# Patient Record
Sex: Male | Born: 1990 | Race: Black or African American | Hispanic: No | Marital: Single | Smoking: Former smoker
Health system: Southern US, Community
[De-identification: ages and names within clinical notes are randomized; demographics above are authoritative.]

## PROBLEM LIST (undated history)

## (undated) DIAGNOSIS — R569 Unspecified convulsions: Secondary | ICD-10-CM

---
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
## 2010-05-20 ENCOUNTER — Emergency Department (HOSPITAL_COMMUNITY): Payer: No Typology Code available for payment source

## 2010-05-20 ENCOUNTER — Emergency Department (HOSPITAL_COMMUNITY)
Admission: EM | Admit: 2010-05-20 | Discharge: 2010-05-20 | Disposition: A | Discharge status: Home / Self Care | Payer: No Typology Code available for payment source | Attending: Emergency Medicine | Admitting: Emergency Medicine

## 2010-05-20 DIAGNOSIS — M79609 Pain in unspecified limb: Secondary | ICD-10-CM | POA: Insufficient documentation

## 2010-05-20 DIAGNOSIS — S8000XA Contusion of unspecified knee, initial encounter: Secondary | ICD-10-CM | POA: Insufficient documentation

## 2010-05-20 DIAGNOSIS — S60229A Contusion of unspecified hand, initial encounter: Secondary | ICD-10-CM | POA: Insufficient documentation

## 2010-05-20 DIAGNOSIS — IMO0002 Reserved for concepts with insufficient information to code with codable children: Secondary | ICD-10-CM | POA: Insufficient documentation

## 2010-05-20 DIAGNOSIS — M25569 Pain in unspecified knee: Secondary | ICD-10-CM | POA: Insufficient documentation

## 2010-05-21 ENCOUNTER — Emergency Department (HOSPITAL_COMMUNITY)
Admission: EM | Admit: 2010-05-21 | Discharge: 2010-05-21 | Disposition: A | Discharge status: Home / Self Care | Payer: No Typology Code available for payment source | Attending: Emergency Medicine | Admitting: Emergency Medicine

## 2010-05-21 DIAGNOSIS — M542 Cervicalgia: Secondary | ICD-10-CM | POA: Insufficient documentation

## 2010-05-21 DIAGNOSIS — S139XXA Sprain of joints and ligaments of unspecified parts of neck, initial encounter: Secondary | ICD-10-CM | POA: Insufficient documentation

## 2010-05-21 DIAGNOSIS — M546 Pain in thoracic spine: Secondary | ICD-10-CM | POA: Insufficient documentation

## 2010-05-21 DIAGNOSIS — M79609 Pain in unspecified limb: Secondary | ICD-10-CM | POA: Insufficient documentation

## 2010-11-20 ENCOUNTER — Emergency Department (HOSPITAL_COMMUNITY): Admission: EM | Admit: 2010-11-20 | Payer: No Typology Code available for payment source | Source: Home / Self Care

## 2012-10-04 ENCOUNTER — Emergency Department (HOSPITAL_COMMUNITY)
Admission: EM | Admit: 2012-10-04 | Discharge: 2012-10-04 | Disposition: A | Discharge status: Home / Self Care | Payer: Self-pay | Attending: Emergency Medicine | Admitting: Emergency Medicine

## 2012-10-04 ENCOUNTER — Encounter (HOSPITAL_COMMUNITY): Payer: Self-pay | Admitting: Emergency Medicine

## 2012-10-04 ENCOUNTER — Emergency Department (HOSPITAL_COMMUNITY): Payer: Self-pay

## 2012-10-04 DIAGNOSIS — R569 Unspecified convulsions: Secondary | ICD-10-CM | POA: Insufficient documentation

## 2012-10-04 DIAGNOSIS — R51 Headache: Secondary | ICD-10-CM | POA: Insufficient documentation

## 2012-10-04 DIAGNOSIS — Z87891 Personal history of nicotine dependence: Secondary | ICD-10-CM | POA: Insufficient documentation

## 2012-10-04 DIAGNOSIS — F1010 Alcohol abuse, uncomplicated: Secondary | ICD-10-CM | POA: Insufficient documentation

## 2012-10-04 LAB — RAPID URINE DRUG SCREEN, HOSP PERFORMED
Barbiturates: NOT DETECTED
Benzodiazepines: NOT DETECTED
Cocaine: NOT DETECTED
Opiates: NOT DETECTED

## 2012-10-04 LAB — BASIC METABOLIC PANEL
BUN: 7 mg/dL (ref 6–23)
CO2: 23 mEq/L (ref 19–32)
Calcium: 8.4 mg/dL (ref 8.4–10.5)
Chloride: 95 mEq/L — ABNORMAL LOW (ref 96–112)
Creatinine, Ser: 1.02 mg/dL (ref 0.50–1.35)
GFR calc Af Amer: >90 mL/min (ref >90)
GFR calc Af Amer: >90 mL/min (ref >90)
GFR calc non Af Amer: >90 mL/min (ref >90)
GFR calc non Af Amer: >90 mL/min (ref >90)
Glucose, Bld: 230 mg/dL — ABNORMAL HIGH (ref 70–99)
Glucose, Bld: 71 mg/dL (ref 70–99)
Sodium: 135 mEq/L (ref 135–145)

## 2012-10-04 LAB — CBC
MCHC: 34.4 g/dL (ref 30.0–36.0)
MCV: 85.2 fL (ref 78.0–100.0)
WBC: 7.8 10*3/uL (ref 4.0–10.5)

## 2012-10-04 LAB — GLUCOSE, CAPILLARY: Glucose-Capillary: 226 mg/dL — ABNORMAL HIGH (ref 70–99)

## 2012-10-04 MED ORDER — SODIUM CHLORIDE 0.9 % IV BOLUS (SEPSIS)
1000.0000 mL | Freq: Once | INTRAVENOUS | Status: AC
  Administered 2012-10-04: 1000 mL via INTRAVENOUS

## 2012-10-04 MED ORDER — ACETAMINOPHEN 325 MG PO TABS
650.0000 mg | ORAL_TABLET | Freq: Once | ORAL | Status: AC
  Administered 2012-10-04: 650 mg via ORAL
  Filled 2012-10-04: qty 2

## 2012-10-04 NOTE — ED Notes (Signed)
Pt undressed, in gown, on monitor, continuous pulse oximetry and blood pressure cuff; family at bedside 

## 2012-10-04 NOTE — ED Notes (Signed)
Per EMS - they were called out because pt had a seizure, no hx of seizures. Witnessed by family, heard him fall then ran over and his full body was shaking lasting approx 2 mins. Pt denies recent injuries/head traumas. No medical hx/NKA. Upon EMS arrival pt was still post-ictal, asked what happened a few times but now aware and can remember, pt in a&ox4. EMS started a 20G in left hand. Pt c/o HA and nausea, no vomiting. EMS administered 4mg  of zofran. CBG 151. BP 110/70. HR 100 96% on room air.

## 2012-10-04 NOTE — ED Provider Notes (Signed)
History    CSN: [id number] Arrival date & time 10/04/12  1130  First MD Initiated Contact with Patient 10/04/12 1152     Chief Complaint  Patient presents with  . Seizures   (Consider location/radiation/quality/duration/timing/severity/associated sxs/prior Treatment) The history is provided by the patient.    Patient presents to the ED for new onset seizure. Grandmother heard the patient fall and ran over to him. She noticed that his body from the waist up was shaking uncontrollably for approximately 2 minutes and then suddenly stopped.  Pt was post-ictal upon EMS arrival.  No recent injuries or head trauma. No diagnosis of seizure disorder or prior seizures.  When questioned about event, patient states he only members waking up with family surrounding him. He states he was oriented but now is very tired and has a generalized headache.  Patient admits to occasional EtOH and marijuana use. CBG en Route 151. BP 110/70. O2 sat stable at 96%. Patient denies any chest pain, shortness of breath, palpitations, dizziness, weakness, abdominal pain, or nausea.  No recent illness, fevers, sweats, or chills.  History reviewed. No pertinent past medical history. History reviewed. No pertinent past surgical history. No family history on file. History  Substance Use Topics  . Smoking status: Former Smoker    Types: Cigarettes  . Smokeless tobacco: Not on file  . Alcohol Use: Yes     Comment: ocasionally    Review of Systems  Neurological: Positive for seizures.  All other systems reviewed and are negative.    Allergies  Review of patient's allergies indicates no known allergies.  Home Medications  No current outpatient prescriptions on file. BP 104/44  Pulse 75  Resp 23  SpO2 94%  Physical Exam  Nursing note and vitals reviewed. Constitutional: He is oriented to person, place, and time. He appears well-developed and well-nourished.  HENT:  Head: Normocephalic. Head is without  raccoon's eyes, without Battle's sign, without abrasion, without contusion and without laceration.  Mouth/Throat: Oropharynx is clear and moist and mucous membranes are normal. No oral lesions. No lacerations.  No signs of head trauma or oral mucosa/tongue injury  Eyes: Conjunctivae and EOM are normal. Pupils are equal, round, and reactive to light.  Neck: Normal range of motion. Neck supple.  Cardiovascular: Normal rate, regular rhythm and normal heart sounds.   Pulmonary/Chest: Effort normal and breath sounds normal. No respiratory distress. He has no wheezes.  Abdominal: Soft. Bowel sounds are normal. There is no tenderness. There is no guarding.  Musculoskeletal: Normal range of motion.  Neurological: He is alert and oriented to person, place, and time. He has normal strength. He displays no tremor. No cranial nerve deficit or sensory deficit. He displays no seizure activity.  CN grossly intact, moves all extremities appropriately, no acute neuro deficits or facial droop appreciated, no active tremors or seizure activity   Skin: Skin is warm and dry.  Psychiatric: He has a normal mood and affect.    ED Course  Procedures (including critical care time)   Date: 10/04/2012  Rate: 68  Rhythm: normal sinus rhythm  QRS Axis: normal  Intervals: normal  ST/T Wave abnormalities: normal  Conduction Disutrbances:none  Narrative Interpretation: NSR, no STEMI  Old EKG Reviewed: none available    Labs Reviewed  BASIC METABOLIC PANEL - Abnormal; Notable for the following:    Chloride 95 (*)    CO2 16 (*)    Glucose, Bld 230 (*)    All other components within normal limits  URINE RAPID DRUG SCREEN (HOSP PERFORMED) - Abnormal; Notable for the following:    Tetrahydrocannabinol POSITIVE (*)    All other components within normal limits  GLUCOSE, CAPILLARY - Abnormal; Notable for the following:    Glucose-Capillary 226 (*)    All other components within normal limits  CBC  ETHANOL  BASIC  METABOLIC PANEL   Ct Head Wo Contrast   (if New Onset Seizure And/or Head Trauma)  10/04/2012   *RADIOLOGY REPORT*  Clinical Data: New onset seizure.  Headache and nausea.  CT HEAD WITHOUT CONTRAST  Technique:  Contiguous axial images were obtained from the base of the skull through the vertex without contrast.  Comparison: None.  Findings: Middle cerebral arteries appears slightly dense bilaterally, which can be seen in the setting of dehydration.  No evidence of acute infarct, acute hemorrhage, mass lesion, mass effect or hydrocephalus.  Visualized portions of the paranasal sinuses are clear.  Fluid is seen in the right mastoid air cells. Left mastoid air cells are clear.  No fracture.  IMPRESSION:  1.  No acute intracranial abnormality. 2.  Right mastoid effusion.   Original Report Authenticated By: Leanna Battles, M.D.   1. Seizure     MDM    EKG normal sinus rhythm, no acute ischemic changes.  CT head negative.  Electrolyte abnormalities as above, anion gap elevated at 24. UDS positive for marijuana. Patient given 2 L of fluid and will reassess anion gap correction with repeat BMP.  Repeat BMP WNL.  Pt remained AAOx4 without recurrent tremors or seizure activity, headache resolved with PO Tylenol.  Encouraged to establish care with PCP in the area for FU- resource guide given.  Discussed plan with pt and mother, they agreed.  Return precautions advised.       Garlon Hatchet, PA-C 10/04/12 1744

## 2012-10-04 NOTE — ED Notes (Signed)
Pt reports he doesn't really remember anything. Pt's mother reports his grandmother was at the house and the pt had just received a package in the mail he was showing her the package then went upstairs, next thing she heard a loud noise and went to check on him, he was on the floor having seizure like activity, saw his upper body shaking. Pt in nad, skin warm and dry, resp e/u. Pt c/o HA and feeling hot.

## 2012-10-04 NOTE — ED Notes (Signed)
Pt admits to smoking weed last night.

## 2012-10-04 NOTE — ED Notes (Signed)
Pt asked from urine sample. Reports he will try to go.

## 2012-10-04 NOTE — ED Notes (Signed)
Pt reports he didn't have a chance to eat this morning. sts his fathers side of the family has some DM hx.

## 2012-10-05 NOTE — ED Provider Notes (Signed)
Medical screening examination/treatment/procedure(s) were performed by non-physician practitioner and as supervising physician I was immediately available for consultation/collaboration.  Aisea Bouldin M Verble Styron, MD 10/05/12 1411 

## 2013-07-25 ENCOUNTER — Emergency Department (HOSPITAL_COMMUNITY)
Admission: EM | Admit: 2013-07-25 | Discharge: 2013-07-25 | Disposition: A | Discharge status: Home / Self Care | Payer: No Typology Code available for payment source | Attending: Emergency Medicine | Admitting: Emergency Medicine

## 2013-07-25 ENCOUNTER — Encounter (HOSPITAL_COMMUNITY): Payer: Self-pay | Admitting: Emergency Medicine

## 2013-07-25 ENCOUNTER — Emergency Department (HOSPITAL_COMMUNITY): Payer: No Typology Code available for payment source

## 2013-07-25 DIAGNOSIS — R5383 Other fatigue: Secondary | ICD-10-CM

## 2013-07-25 DIAGNOSIS — R109 Unspecified abdominal pain: Secondary | ICD-10-CM | POA: Insufficient documentation

## 2013-07-25 DIAGNOSIS — N4889 Other specified disorders of penis: Secondary | ICD-10-CM | POA: Insufficient documentation

## 2013-07-25 DIAGNOSIS — Z87891 Personal history of nicotine dependence: Secondary | ICD-10-CM | POA: Insufficient documentation

## 2013-07-25 DIAGNOSIS — R0602 Shortness of breath: Secondary | ICD-10-CM | POA: Insufficient documentation

## 2013-07-25 DIAGNOSIS — Z9889 Other specified postprocedural states: Secondary | ICD-10-CM | POA: Insufficient documentation

## 2013-07-25 DIAGNOSIS — R5381 Other malaise: Secondary | ICD-10-CM | POA: Insufficient documentation

## 2013-07-25 DIAGNOSIS — R112 Nausea with vomiting, unspecified: Secondary | ICD-10-CM | POA: Insufficient documentation

## 2013-07-25 DIAGNOSIS — N489 Disorder of penis, unspecified: Secondary | ICD-10-CM

## 2013-07-25 DIAGNOSIS — R079 Chest pain, unspecified: Secondary | ICD-10-CM

## 2013-07-25 DIAGNOSIS — R638 Other symptoms and signs concerning food and fluid intake: Secondary | ICD-10-CM | POA: Insufficient documentation

## 2013-07-25 DIAGNOSIS — R509 Fever, unspecified: Secondary | ICD-10-CM | POA: Insufficient documentation

## 2013-07-25 DIAGNOSIS — R0789 Other chest pain: Secondary | ICD-10-CM | POA: Insufficient documentation

## 2013-07-25 LAB — CBC WITH DIFFERENTIAL/PLATELET
Basophils Absolute: 0 10*3/uL (ref 0.0–0.1)
Basophils Relative: 1 % (ref 0–1)
EOS PCT: 1 % (ref 0–5)
Eosinophils Absolute: 0 10*3/uL (ref 0.0–0.7)
HCT: 44.2 % (ref 39.0–52.0)
HEMOGLOBIN: 15.5 g/dL (ref 13.0–17.0)
LYMPHS ABS: 2.8 10*3/uL (ref 0.7–4.0)
Lymphocytes Relative: 32 % (ref 12–46)
MCH: 30 pg (ref 26.0–34.0)
MCHC: 35.1 g/dL (ref 30.0–36.0)
MCV: 85.7 fL (ref 78.0–100.0)
Monocytes Absolute: 0.5 10*3/uL (ref 0.1–1.0)
Monocytes Relative: 6 % (ref 3–12)
Neutro Abs: 5.3 10*3/uL (ref 1.7–7.7)
Neutrophils Relative %: 60 % (ref 43–77)
Platelets: 207 10*3/uL (ref 150–400)
RBC: 5.16 MIL/uL (ref 4.22–5.81)
RDW: 13 % (ref 11.5–15.5)
WBC: 8.7 10*3/uL (ref 4.0–10.5)

## 2013-07-25 LAB — COMPREHENSIVE METABOLIC PANEL
ALT: 15 U/L (ref 0–53)
AST: 16 U/L (ref 0–37)
Albumin: 4.2 g/dL (ref 3.5–5.2)
Alkaline Phosphatase: 55 U/L (ref 39–117)
BUN: 7 mg/dL (ref 6–23)
CO2: 27 mEq/L (ref 19–32)
CREATININE: 0.87 mg/dL (ref 0.50–1.35)
Calcium: 10.1 mg/dL (ref 8.4–10.5)
Chloride: 102 mEq/L (ref 96–112)
GFR calc non Af Amer: >90 mL/min (ref >90)
GLUCOSE: 99 mg/dL (ref 70–99)
Potassium: 4.6 mEq/L (ref 3.7–5.3)
Sodium: 141 mEq/L (ref 137–147)
Total Bilirubin: 0.4 mg/dL (ref 0.3–1.2)
Total Protein: 7.6 g/dL (ref 6.0–8.3)

## 2013-07-25 LAB — URINALYSIS, ROUTINE W REFLEX MICROSCOPIC
Bilirubin Urine: NEGATIVE
Glucose, UA: NEGATIVE mg/dL
HGB URINE DIPSTICK: NEGATIVE
Ketones, ur: NEGATIVE mg/dL
Leukocytes, UA: NEGATIVE
Nitrite: NEGATIVE
PROTEIN: NEGATIVE mg/dL
Specific Gravity, Urine: 1.011 (ref 1.005–1.030)
UROBILINOGEN UA: 0.2 mg/dL (ref 0.0–1.0)
pH: 7 (ref 5.0–8.0)

## 2013-07-25 LAB — LIPASE, BLOOD: LIPASE: 15 U/L (ref 11–59)

## 2013-07-25 LAB — TROPONIN I: Troponin I: <0.3 ng/mL (ref <0.30)

## 2013-07-25 MED ORDER — CEFTRIAXONE SODIUM 250 MG IJ SOLR
250.0000 mg | Freq: Once | INTRAMUSCULAR | Status: AC
  Administered 2013-07-25: 250 mg via INTRAMUSCULAR
  Filled 2013-07-25: qty 250

## 2013-07-25 MED ORDER — SODIUM CHLORIDE 0.9 % IV BOLUS (SEPSIS)
1000.0000 mL | Freq: Once | INTRAVENOUS | Status: AC
  Administered 2013-07-25: 1000 mL via INTRAVENOUS

## 2013-07-25 MED ORDER — ONDANSETRON 4 MG PO TBDP: 4.0000 mg | ORAL_TABLET | Freq: Three times a day (TID) | ORAL | Status: DC | PRN

## 2013-07-25 MED ORDER — ONDANSETRON 4 MG PO TBDP
4.0000 mg | ORAL_TABLET | Freq: Once | ORAL | Status: AC
  Administered 2013-07-25: 4 mg via ORAL
  Filled 2013-07-25: qty 1

## 2013-07-25 MED ORDER — PENICILLIN G BENZATHINE 1200000 UNIT/2ML IM SUSP
2.4000 10*6.[IU] | Freq: Once | INTRAMUSCULAR | Status: AC
  Administered 2013-07-25: 2.4 10*6.[IU] via INTRAMUSCULAR
  Filled 2013-07-25: qty 4

## 2013-07-25 MED ORDER — AZITHROMYCIN 250 MG PO TABS
1000.0000 mg | ORAL_TABLET | Freq: Once | ORAL | Status: AC
  Administered 2013-07-25: 1000 mg via ORAL
  Filled 2013-07-25: qty 4

## 2013-07-25 NOTE — ED Notes (Signed)
Pt reports he is here for a bump on his penis, reports it started out very small and has grown in size. Denies pile d/c. Denies pain to testicles. Nad, skin warm and dry, resp e/u.

## 2013-07-25 NOTE — ED Notes (Signed)
Pt given 2 glasses of water, told to drink as much as he could. Pt denies nausea.

## 2013-07-25 NOTE — ED Notes (Signed)
Pt c/o nausea.  

## 2013-07-25 NOTE — Discharge Instructions (Signed)
Take Ibuprofen every 6-8 hours (600-800 mg) for chest wall pain Take Zofran for nausea as needed  Eat a clear liquid diet for the next 24-48 hours to help give your GI system rest  Avoid sexual intercourse until evaluated by a doctor and STD testing is resulted  Return to the emergency department if you develop any changing/worsening condition, feeling lightheaded, difficulty breathing, repeated vomiting, abdominal pain, fever or any other concerns (please read additional information regarding your condition below)    Chest Pain (Nonspecific) It is often hard to give a specific diagnosis for the cause of chest pain. There is always a chance that your pain could be related to something serious, such as a heart attack or a blood clot in the lungs. You need to follow up with your caregiver for further evaluation. CAUSES   Heartburn.  Pneumonia or bronchitis.  Anxiety or stress.  Inflammation around your heart (pericarditis) or lung (pleuritis or pleurisy).  A blood clot in the lung.  A collapsed lung (pneumothorax). It can develop suddenly on its own (spontaneous pneumothorax) or from injury (trauma) to the chest.  Shingles infection (herpes zoster virus). The chest wall is composed of bones, muscles, and cartilage. Any of these can be the source of the pain.  The bones can be bruised by injury.  The muscles or cartilage can be strained by coughing or overwork.  The cartilage can be affected by inflammation and become sore (costochondritis). DIAGNOSIS  Lab tests or other studies, such as X-rays, electrocardiography, stress testing, or cardiac imaging, may be needed to find the cause of your pain.  TREATMENT   Treatment depends on what may be causing your chest pain. Treatment may include:  Acid blockers for heartburn.  Anti-inflammatory medicine.  Pain medicine for inflammatory conditions.  Antibiotics if an infection is present.  You may be advised to change lifestyle  habits. This includes stopping smoking and avoiding alcohol, caffeine, and chocolate.  You may be advised to keep your head raised (elevated) when sleeping. This reduces the chance of acid going backward from your stomach into your esophagus.  Most of the time, nonspecific chest pain will improve within 2 to 3 days with rest and mild pain medicine. HOME CARE INSTRUCTIONS   If antibiotics were prescribed, take your antibiotics as directed. Finish them even if you start to feel better.  For the next few days, avoid physical activities that bring on chest pain. Continue physical activities as directed.  Do not smoke.  Avoid drinking alcohol.  Only take over-the-counter or prescription medicine for pain, discomfort, or fever as directed by your caregiver.  Follow your caregiver's suggestions for further testing if your chest pain does not go away.  Keep any follow-up appointments you made. If you do not go to an appointment, you could develop lasting (chronic) problems with pain. If there is any problem keeping an appointment, you must call to reschedule. SEEK MEDICAL CARE IF:   You think you are having problems from the medicine you are taking. Read your medicine instructions carefully.  Your chest pain does not go away, even after treatment.  You develop a rash with blisters on your chest. SEEK IMMEDIATE MEDICAL CARE IF:   You have increased chest pain or pain that spreads to your arm, neck, jaw, back, or abdomen.  You develop shortness of breath, an increasing cough, or you are coughing up blood.  You have severe back or abdominal pain, feel nauseous, or vomit.  You develop severe weakness,  fainting, or chills.  You have a fever. THIS IS AN EMERGENCY. Do not wait to see if the pain will go away. Get medical help at once. Call your local emergency services (911 in U.S.). Do not drive yourself to the hospital. MAKE SURE YOU:   Understand these instructions.  Will watch your  condition.  Will get help right away if you are not doing well or get worse. Document Released: 01/06/2005 Document Revised: 06/21/2011 Document Reviewed: 11/02/2007 [facility] Patient Information 2014 [place].   Nausea and Vomiting Nausea is a sick feeling that often comes before throwing up (vomiting). Vomiting is a reflex where stomach contents come out of your mouth. Vomiting can cause severe loss of body fluids (dehydration). Children and elderly adults can become dehydrated quickly, especially if they also have diarrhea. Nausea and vomiting are symptoms of a condition or disease. It is important to find the cause of your symptoms. CAUSES   Direct irritation of the stomach lining. This irritation can result from increased acid production (gastroesophageal reflux disease), infection, food poisoning, taking certain medicines (such as nonsteroidal anti-inflammatory drugs), alcohol use, or tobacco use.  Signals from the brain.These signals could be caused by a headache, heat exposure, an inner ear disturbance, increased pressure in the brain from injury, infection, a tumor, or a concussion, pain, emotional stimulus, or metabolic problems.  An obstruction in the gastrointestinal tract (bowel obstruction).  Illnesses such as diabetes, hepatitis, gallbladder problems, appendicitis, kidney problems, cancer, sepsis, atypical symptoms of a heart attack, or eating disorders.  Medical treatments such as chemotherapy and radiation.  Receiving medicine that makes you sleep (general anesthetic) during surgery. DIAGNOSIS Your caregiver may ask for tests to be done if the problems do not improve after a few days. Tests may also be done if symptoms are severe or if the reason for the nausea and vomiting is not clear. Tests may include:  Urine tests.  Blood tests.  Stool tests.  Cultures (to look for evidence of infection).  X-rays or other imaging studies. Test results can help your  caregiver make decisions about treatment or the need for additional tests. TREATMENT You need to stay well hydrated. Drink frequently but in small amounts.You may wish to drink water, sports drinks, clear broth, or eat frozen ice pops or gelatin dessert to help stay hydrated.When you eat, eating slowly may help prevent nausea.There are also some antinausea medicines that may help prevent nausea. HOME CARE INSTRUCTIONS   Take all medicine as directed by your caregiver.  If you do not have an appetite, do not force yourself to eat. However, you must continue to drink fluids.  If you have an appetite, eat a normal diet unless your caregiver tells you differently.  Eat a variety of complex carbohydrates (rice, wheat, potatoes, bread), lean meats, yogurt, fruits, and vegetables.  Avoid high-fat foods because they are more difficult to digest.  Drink enough water and fluids to keep your urine clear or pale yellow.  If you are dehydrated, ask your caregiver for specific rehydration instructions. Signs of dehydration may include:  Severe thirst.  Dry lips and mouth.  Dizziness.  Dark urine.  Decreasing urine frequency and amount.  Confusion.  Rapid breathing or pulse. SEEK IMMEDIATE MEDICAL CARE IF:   You have blood or brown flecks (like coffee grounds) in your vomit.  You have black or bloody stools.  You have a severe headache or stiff neck.  You are confused.  You have severe abdominal pain.  You  have chest pain or trouble breathing.  You do not urinate at least once every 8 hours.  You develop cold or clammy skin.  You continue to vomit for longer than 24 to 48 hours.  You have a fever. MAKE SURE YOU:   Understand these instructions.  Will watch your condition.  Will get help right away if you are not doing well or get worse. Document Released: 03/29/2005 Document Revised: 06/21/2011 Document Reviewed: 08/26/2010 [facility] Patient Information 2014  [place], [place].  Genital Warts Genital warts are a sexually transmitted infection. They may appear as small bumps on the tissues of the genital area. CAUSES  Genital warts are caused by a virus called human papillomavirus (HPV). HPV is the most common sexually transmitted disease (STD) and infection of the sex organs. This infection is spread by having unprotected sex with an infected person. It can be spread by vaginal, anal, and oral sex. Many people do not know they are infected. They may be infected for years without problems. However, even if they do not have problems, they can unknowingly pass the infection to their sexual partners. SYMPTOMS   Itching and irritation in the genital area.  Warts that bleed.  Painful sexual intercourse. DIAGNOSIS  Warts are usually recognized with the naked eye on the vagina, vulva, perineum, anus, and rectum. Certain tests can also diagnose genital warts, such as:  A Pap test.  A tissue sample (biopsy) exam.  Colposcopy. A magnifying tool is used to examine the vagina and cervix. The HPV cells will change color when certain solutions are used. TREATMENT  Warts can be removed by:  Applying certain chemicals, such as cantharidin or podophyllin.  Liquid nitrogen freezing (cryotherapy).  Immunotherapy with candida or trichophyton injections.  Laser treatment.  Burning with an electrified probe (electrocautery).  Interferon injections.  Surgery. PREVENTION  HPV vaccination can help prevent HPV infections that cause genital warts and that cause cancer of the cervix. It is recommended that the vaccination be given to people between the ages 14 to 35 years old. The vaccine might not work as well or might not work at all if you already have HPV. It should not be given to pregnant women. HOME CARE INSTRUCTIONS   It is important to follow your caregiver's instructions. The warts will not go away without treatment. Repeat treatments are often needed to  get rid of warts. Even after it appears that the warts are gone, the normal tissue underneath often remains infected.  Do not try to treat genital warts with medicine used to treat hand warts. This type of medicine is strong and can burn the skin in the genital area, causing more damage.  Tell your past and current sexual partner(s) that you have genital warts. They may be infected also and need treatment.  Avoid sexual contact while being treated.  Do not touch or scratch the warts. The infection may spread to other parts of your body.  Women with genital warts should have a cervical cancer check (Pap test) at least once a year. This type of cancer is slow-growing and can be cured if found early. Chances of developing cervical cancer are increased with HPV.  Inform your obstetrician about your warts in the event of pregnancy. This virus can be passed to the baby's respiratory tract. Discuss this with your caregiver.  Use a condom during sexual intercourse. Following treatment, the use of condoms will help prevent reinfection.  Ask your caregiver about using over-the-counter anti-itch creams. SEEK MEDICAL  CARE IF:   Your treated skin becomes red, swollen, or painful.  You have a fever.  You feel generally ill.  You feel little lumps in and around your genital area.  You are bleeding or have painful sexual intercourse. MAKE SURE YOU:   Understand these instructions.  Will watch your condition.  Will get help right away if you are not doing well or get worse. Document Released: 03/26/2000 Document Revised: 06/21/2011 Document Reviewed: 10/05/2010 [facility] Patient Information 2014 [place], [place].  Molluscum Contagiosum Molluscum contagiosum is a viral infection of the skin that causes smooth surfaced, firm, small (3 to 5 mm), dome-shaped bumps (papules) which are flesh-colored. The bumps usually do not hurt or itch. In children, they most often appear on the face, trunk, arms and  legs. In adults, the growths are commonly found on the genitals, thighs, face, neck, and belly (abdomen). The infection may be spread to others by close (skin to skin) contact (such as occurs in schools and swimming pools), sharing towels and clothing, and through sexual contact. The bumps usually disappear without treatment in 2 to 4 months, especially in children. You may have them treated to avoid spreading them. Scraping (curetting) the middle part (central plug) of the bump with a needle or sharp curette, or application of liquid nitrogen for 8 or 9 seconds usually cures the infection. HOME CARE INSTRUCTIONS   Do not scratch the bumps. This may spread the infection to other parts of the body and to other people.  Avoid close contact with others, including sexual contact, until the bumps disappear. Do not share towels or clothing.  If liquid nitrogen was used, blisters will form. Leave the blisters alone and cover with a bandage. The tops will fall off by themselves in 7 to 14 days.  Four months without a lesion is usually a cure. SEEK IMMEDIATE MEDICAL CARE IF:  You have a fever.  You develop swelling, redness, pain, tenderness, or warmth in the areas of the bumps. They may be infected. Document Released: 03/26/2000 Document Revised: 06/21/2011 Document Reviewed: 09/06/2008 [facility] Patient Information 2014 [place], [place].   Emergency Department Resource Guide 1) Find a Doctor and Pay Out of Pocket Although you won't have to find out who is covered by your insurance plan, it is a good idea to ask around and get recommendations. You will then need to call the office and see if the doctor you have chosen will accept you as a new patient and what types of options they offer for patients who are self-pay. Some doctors offer discounts or will set up payment plans for their patients who do not have insurance, but you will need to ask so you aren't surprised when you get to your  appointment.  2) Contact Your Local Health Department Not all health departments have doctors that can see patients for sick visits, but many do, so it is worth a call to see if yours does. If you don't know where your local health department is, you can check in your phone book. The CDC also has a tool to help you locate your state's health department, and many state websites also have listings of all of their local health departments.  3) Find a Walk-in Clinic If your illness is not likely to be very severe or complicated, you may want to try a walk in clinic. These are popping up all over the country in pharmacies, drugstores, and shopping centers. They're usually staffed by nurse practitioners or physician assistants  that have been trained to treat common illnesses and complaints. They're usually fairly quick and inexpensive. However, if you have serious medical issues or chronic medical problems, these are probably not your best option.  No Primary Care Doctor: - Call Health Connect at  [phone] - they can help you locate a primary care doctor that  accepts your insurance, provides certain services, etc. - Physician Referral Service- [phone]  Chronic Pain Problems: Organization         Address  Phone   Notes  [facility]  [phone] Patients need to be referred by their primary care doctor.   Medication Assistance: Organization         Address  Phone   Notes  [facility] Medication [facility] [address] [place] [place], [place] [address] [phone] --Must be a resident of [facility] -- Must have NO insurance coverage whatsoever (no Medicaid/ Medicare, etc.) -- The pt. MUST have a primary care doctor that directs their care regularly and follows them in the community   [facility]  [phone]   [employer]  [phone]    Agencies that provide inexpensive medical care: Organization         Address  Phone   Notes  [facility]  [phone]   [facility]    [phone]   [facility] [address] [place], [place] [address] [phone]   [facility] [address] [place] [phone]   Planned Parenthood    [phone]   [facility]    [phone]   [facility]  [address], [place] Phone:  [phone], Fax:  [phone] Hours of Operation:  9 am - 6 pm, M-F.  Also accepts Medicaid/Medicare and self-pay.  [facility]  [address], [place] Phone: [phone], Fax: [phone]. Hours of Operation:  8:30 am - 5:30 pm, M-F.  Also accepts Medicaid and self-pay.  [facility] [place] [address] [place] Phone: [phone]   [facility] [address] Natasha Bence [place], [place] [phone], Ext. [phone] Mondays & Thursdays: 7-9 AM.  First 15 patients are seen on a first come, first serve basis.    Medicaid-accepting [facility] Providers:  Organization         Address  Phone   Notes  [facility] [address],  [phone] Also accepts self-pay patients.  [facility] [address] Laurell Josephs [place], [place]  [phone]   [facility] [address], [place] [phone]   [facility] [address] [place] [phone]   Renaye Rakers [address], [place]   [phone] Only accepts [place] patients after they have their name applied to their card.   Self-Pay (no insurance) in [facility]:  Organization         Address  Phone   Notes  Sickle Cell Patients, [facility] [address] [place], [place] [phone]   [facility] [address] [place], [place] [phone]   [facility]  [place]  [address], [place] [phone]   [facility]/Dr. Osei-Bonsu  [address] [place] [address],  [facility] or [address], [place] [phone] Phone number for both [place] and [place] locations is the same.  [facility] [address] [place] [phone]   [facility] [address] [place] or [address] [phone]   [facility] [address], [place] [phone], phone; [phone], fax Sees patients 1st and 3rd Saturday of every month.  Must not qualify for public or private insurance (i.e. Medicaid, Medicare, [place] Health Choice, Veterans' Benefits)  Household income should be no more than 200% of the poverty level The clinic cannot treat you if you are pregnant or think you are pregnant  Sexually transmitted diseases are not treated at the clinic.    Dental Care: Organization         Address  Phone  Notes  [facility] [address] [place], [place] [phone] Accepts children up to age 4 who are enrolled in [place] or [place] Health Choice; pregnant women with a Medicaid card; and children who have applied for Medicaid or [place] Health Choice, but were declined, whose parents can pay a reduced fee at time of service.  [facility]  [address], [place] [phone] Accepts children up to age 63 who are enrolled in [place] or [place] Health Choice; pregnant women with a Medicaid card; and children who have applied for Medicaid or  Health Choice, but were declined, whose parents can pay a reduced fee at time of service.  [facility]  [address] [place], [place] [phone] Patients are seen by appointment only. Walk-ins are not accepted. [facility] will see patients 52 years of age and  older. Monday - Tuesday (8am-5pm) Most Wednesdays (8:30-5pm) $30 per visit, cash only  [facility] Adult [facility]  [address], [facility] [phone] Patients are seen by appointment only. Walk-ins are not accepted. [facility] will see patients 35 years of age and older. One Wednesday Evening (Monthly: Volunteer Based).  $30 per visit, cash only  [employer]  [phone] for adults; Children under age 92, call Graduate Pediatric Dentistry at [phone]. Children aged 61-14, please call [phone] to request a pediatric application.  Dental services are provided in all areas of dental care including fillings, crowns and bridges, complete and partial dentures, implants, gum treatment, root canals, and extractions. Preventive care is also provided. Treatment is provided to both adults and children. Patients are selected via a lottery and there is often a waiting list.   [facility] [address] [place]  [phone] [email or web address]   [facility] [address] [place], [place] [phone], Ext. [phone] Second and Fourth Thursday of each month, opens at 6:30 AM; Clinic ends at 9 AM.  Patients are seen on a first-come first-served basis, and a limited number are seen during each clinic.   [facility]  [address] Ether Griffins [place], [place] [phone]   Eligibility Requirements You must have lived in [place], [place], or [place] counties for at least the last three months.   You cannot be eligible for state or federal sponsored [place], including [employer], [place], or [employer].   You generally cannot be eligible for healthcare insurance through your employer.    How to apply: Eligibility  screenings are held every Tuesday and Wednesday afternoon from 1:00 pm until 4:00 pm. You do not need an appointment for the interview!  [facility] [address],  [place], [place] [phone]   [facility]  [phone]   [facility]  [phone]   [facility]  [phone]    Behavioral Health Resources in the Community: Intensive Outpatient Programs Organization         Address  Phone  Notes  [facility] [address], [place], [place] [phone]   [facility] [address], [place], [place] [phone]   [facility] [address], [place], [place]  [phone]   [facility] [address],  [place], [place] [phone] or [phone]   Substance Abuse Resources Organization         Address  Phone  Notes  [facility]  [phone]   [facility]  [phone]   [facility]  [phone]   [facility]  [phone]   Residential & Outpatient Substance Abuse Program  [phone]   Psychological Services Organization         Address  Phone  Notes  [facility]  [phone]- [phone]   [facility]  [phone]   [facility] [address], [place] [phone] or [phone]    Mobile Crisis Teams Organization         Address  Phone  Notes  [facility], Mobile Crisis Care Unit  [phone]   [facility]  [address] [place], [place] [phone]   Doristine LocksSharon DeEsch [address] [place] [phone]    Self-Help/Support Groups Organization         Address  Phone             Notes  [facility] - variety of support groups  336- [phone] Call for more information  Narcotics Anonymous (NA), [facility] [address], [place]  2 meetings at this location   [facility] Organization         Address  Phone  Notes  [facility] [address],    [place]  [phone]   [facility]  [address], [place] [phone], [place], [place] [phone]   [facility] [address], [place] [phone] Admissions: 8am-3pm M-F  [facility] [address]    [place], [place] [phone]   [facility] [address], [place], [place] [phone]   [facility] [address]  [place], [place] [phone]   [facility] [address], [place], [place] [phone]   [facility] [address]  [place], [place] [phone] or [phone]   [facility] [address] [place], [place] [phone] Accepts Medicaid  [facility] [address]  [place] [phone] Substance Abuse/Addiction Treatment   [facility] Organization         Address  Phone  Notes  [facility]  [phone]   Angie FavaJulie Brannon, PhD [address], [address] [place], [place]   [phone] or [phone]   [facility]   [address] [place], [place] [phone]   [facility] [address], [place], [place] [phone] Insurance/Medicaid/sponsorship through [facility] [address] [place], [place] [phone] [place], [place] [phone]    Dr. Adele Schilder  [phone]   [facility] Dept. 1) [address], [place] 2) [place] 3)  [address], [place] [phone]  [phone]   [place] [phone] or [phone] (After Hours)

## 2013-07-25 NOTE — ED Notes (Signed)
Pt states he hasn't felt good since Saturday, has been N/V and having left sided cp that hurts more when lying down. States he has been SOB also. NAD at this time.

## 2013-07-25 NOTE — ED Provider Notes (Signed)
CSN: [id number]     Arrival date & time 07/25/13  1311 History   First MD Initiated Contact with Patient 07/25/13 1527     Chief Complaint  Patient presents with  . Nausea  . Emesis  . Chest Pain    HPI  Chris Krause is a 23 y.o. male with no PMH who presents to the ED for evaluation of nausea, emesis, and chest pain. History was provided by the patient. Patient states he has not been feeling well for the past 5 days. He reports continued nausea with episodes of emesis. He states he vomited 5 days ago and 2 days ago. He did not have any vomiting today. He denies any hematemesis. He tried taking Pepto-Bismol however does not improve his symptoms. He states that eating makes him feel nauseated and has not had much to eat or drink over the past few days. He denies any known sick contacts. He also has been having subjective fever and chills. He denies any diarrhea or constipation. He has had intermittent left-sided sharp abdominal pain with his nausea. He denies any abdominal pain currently. He denies any previous abdominal surgeries. He denies any previous symptoms in the past. Patient states he also has been having left-sided chest pain intermittently over the past 5 days. He describes his pain as an aching sensation which is worse with movement and laying on his left side. He denies any chest pain currently. He states that he only has pain when he lays on his left side. He denies any trauma or injuries. He denies any cough. He states that when he has the pain it "takes his breath away". He denies any shortness of breath at rest or currently. He denies any leg swelling, recent travel or surgeries, or history of DVT or PE. He also complains of a sore on his penis which has been present for 7 days. He states it started as a small pimple and he started scratching it resulting in a scab. He states he is currently sexually active with no history of STDs or concerns for STDs. He denies any penile discharge,  testicular pain, penile pain, or dysuria.    History reviewed. No pertinent past medical history. History reviewed. No pertinent past surgical history. No family history on file. History  Substance Use Topics  . Smoking status: Former Smoker    Types: Cigarettes  . Smokeless tobacco: Not on file  . Alcohol Use: Yes     Comment: ocasionally    Review of Systems  Constitutional: Positive for fever (subjective), chills, appetite change and fatigue. Negative for diaphoresis and activity change.  HENT: Negative for congestion, rhinorrhea and sore throat.   Eyes: Negative for photophobia and visual disturbance.  Respiratory: Positive for shortness of breath (see HPI). Negative for cough, chest tightness and wheezing.   Cardiovascular: Positive for chest pain. Negative for palpitations and leg swelling.  Gastrointestinal: Positive for nausea, vomiting and abdominal pain (see HPI). Negative for diarrhea, constipation, blood in stool and rectal pain.  Genitourinary: Positive for genital sores. Negative for dysuria, urgency, hematuria, discharge, penile swelling, scrotal swelling, penile pain and testicular pain.  Musculoskeletal: Negative for back pain, myalgias and neck pain.  Skin: Negative for color change and wound.  Neurological: Negative for dizziness, weakness, light-headedness, numbness and headaches.    Allergies  Review of patient's allergies indicates no known allergies.  Home Medications   Prior to Admission medications   Not on File   BP 108/66  Pulse 63  Temp(Src) 99 F (37.2 C) (Oral)  Resp 16  SpO2 100%  Filed Vitals:   07/25/13 1712 07/25/13 1730 07/25/13 1800 07/25/13 2006  BP: 118/89 107/54 107/65 122/77  Pulse: 67 60 60 98  Temp:    98.2 F (36.8 C)  TempSrc:    Oral  Resp: 16   16  SpO2: 100% 100% 100% 98%    Physical Exam  Nursing note and vitals reviewed. Constitutional: He is oriented to person, place, and time. He appears well-developed and  well-nourished. No distress.  HENT:  Head: Normocephalic and atraumatic.  Right Ear: External ear normal.  Left Ear: External ear normal.  Mouth/Throat: Oropharynx is clear and moist.  Eyes: Conjunctivae are normal. Right eye exhibits no discharge. Left eye exhibits no discharge.  Neck: Normal range of motion. Neck supple.  Cardiovascular: Normal rate, regular rhythm, normal heart sounds and intact distal pulses.  Exam reveals no gallop and no friction rub.   No murmur heard. Pulmonary/Chest: Effort normal and breath sounds normal. No respiratory distress. He has no wheezes. He has no rales. He exhibits tenderness.  Diffuse left-sided and left rib tenderness to palpation in the mid axillary line. No crepitus, edema, ecchymosis, step-offs, or wounds.  Abdominal: Soft. Bowel sounds are normal. He exhibits no distension and no mass. There is no tenderness. There is no rebound and no guarding.  Genitourinary:  3 mm circular scab present on the mid-shaft anterior portion of the penis. No open lesions or drainage. 3-4 flesh-colored papular 1 cm circular masses to the midshaft of the penis. Area non-tender. No ulcer. Bilateral less than 1 cm firm/mobile inguinal lymphadenopathy bilaterally. No testicular tenderness bilaterally.   Musculoskeletal: Normal range of motion. He exhibits no edema and no tenderness.  No LE edema or calf tenderness bilaterally  Neurological: He is alert and oriented to person, place, and time.  Skin: Skin is warm and dry. He is not diaphoretic.    ED Course  Procedures (including critical care time) Labs Review Labs Reviewed - No data to display  Imaging Review Dg Chest 2 View  07/25/2013   CLINICAL DATA:  Left side chest pain, nausea, chills, vomiting  EXAM: CHEST  2 VIEW  COMPARISON:  None  FINDINGS: Normal heart size, mediastinal contours, and pulmonary vascularity.  Lungs hyperinflated but clear.  No pleural effusion or pneumothorax.  No acute osseous findings.   IMPRESSION: Hyperinflated lungs without acute infiltrate.   Electronically Signed   By: Ulyses SouthwardMark  Boles M.D.   On: 07/25/2013 13:58     EKG Interpretation None      Results for orders placed during the hospital encounter of 07/25/13  CBC WITH DIFFERENTIAL      Result Value Ref Range   WBC 8.7  4.0 - 10.5 K/uL   RBC 5.16  4.22 - 5.81 MIL/uL   Hemoglobin 15.5  13.0 - 17.0 g/dL   HCT 95.644.2  38.739.0 - 56.452.0 %   MCV 85.7  78.0 - 100.0 fL   MCH 30.0  26.0 - 34.0 pg   MCHC 35.1  30.0 - 36.0 g/dL   RDW 33.213.0  95.111.5 - 88.415.5 %   Platelets 207  150 - 400 K/uL   Neutrophils Relative % 60  43 - 77 %   Neutro Abs 5.3  1.7 - 7.7 K/uL   Lymphocytes Relative 32  12 - 46 %   Lymphs Abs 2.8  0.7 - 4.0 K/uL   Monocytes Relative 6  3 - 12 %  Monocytes Absolute 0.5  0.1 - 1.0 K/uL   Eosinophils Relative 1  0 - 5 %   Eosinophils Absolute 0.0  0.0 - 0.7 K/uL   Basophils Relative 1  0 - 1 %   Basophils Absolute 0.0  0.0 - 0.1 K/uL  COMPREHENSIVE METABOLIC PANEL      Result Value Ref Range   Sodium 141  137 - 147 mEq/L   Potassium 4.6  3.7 - 5.3 mEq/L   Chloride 102  96 - 112 mEq/L   CO2 27  19 - 32 mEq/L   Glucose, Bld 99  70 - 99 mg/dL   BUN 7  6 - 23 mg/dL   Creatinine, Ser 8.11  0.50 - 1.35 mg/dL   Calcium 91.4  8.4 - 78.2 mg/dL   Total Protein 7.6  6.0 - 8.3 g/dL   Albumin 4.2  3.5 - 5.2 g/dL   AST 16  0 - 37 U/L   ALT 15  0 - 53 U/L   Alkaline Phosphatase 55  39 - 117 U/L   Total Bilirubin 0.4  0.3 - 1.2 mg/dL   GFR calc non Af Amer >90  >90 mL/min   GFR calc Af Amer >90  >90 mL/min  LIPASE, BLOOD      Result Value Ref Range   Lipase 15  11 - 59 U/L  TROPONIN I      Result Value Ref Range   Troponin I <0.30  <0.30 ng/mL  URINALYSIS, ROUTINE W REFLEX MICROSCOPIC      Result Value Ref Range   Color, Urine YELLOW  YELLOW   APPearance CLEAR  CLEAR   Specific Gravity, Urine 1.011  1.005 - 1.030   pH 7.0  5.0 - 8.0   Glucose, UA NEGATIVE  NEGATIVE mg/dL   Hgb urine dipstick NEGATIVE  NEGATIVE    Bilirubin Urine NEGATIVE  NEGATIVE   Ketones, ur NEGATIVE  NEGATIVE mg/dL   Protein, ur NEGATIVE  NEGATIVE mg/dL   Urobilinogen, UA 0.2  0.0 - 1.0 mg/dL   Nitrite NEGATIVE  NEGATIVE   Leukocytes, UA NEGATIVE  NEGATIVE     MDM   Chris Krause is a 23 y.o. male with no PMH who presents to the ED for evaluation of nausea, emesis, and chest pain.   Rechecks  7:20 PM = Patient asymptomatic. Able to tolerate PO fluids.    Patient has multiple complaints. Patient complained of intermittent episodes of emesis throughout the past 5 days. Able to tolerate PO challenge without difficulty or emesis. Intermittent abdominal pain but not during ED visit. Abdominal exam benign. Labs unremarkable. Patient also complained of chest pain which was reproducible. Possible musculoskeletal from vomiting. Chest x-ray negative for an acute cardiopulmonary process. EKG negative for any acute ischemic changes. Troponin negative. No chest pain or SOB throughout ED visit. Vital signs stable. Patient afebrile and non-toxic in appearance. Patient also complained of a genial lesion with unclear etiology. Doubt herpes. Patient tested for G/C, HIV, and RPR. UA negative for UTI. Patient treated with Rocephin, Penicillin, and Azithromycin in the ED. Instructed to follow-up with PCP for further evaluation and management. Genital warts and molluscum contagiosum also possible causes. Patient instructed to refrain from sexual intercourse until evaluated. Return precautions, discharge instructions, and follow-up was discussed with the patient before discharge.     Discharge Medication List as of 07/25/2013  7:27 PM    START taking these medications   Details  ondansetron (ZOFRAN ODT) 4 MG disintegrating tablet  Take 1 tablet (4 mg total) by mouth every 8 (eight) hours as needed for nausea., Starting 07/25/2013, Until Discontinued, Print         Final impressions: 1. Nausea and vomiting   2. Chest pain   3. Penile lesion        Luiz Iron PA-C   This patient was discussed with Dr. Karlton Lemon, PA-C 07/26/13 1153

## 2013-07-26 LAB — RPR

## 2013-07-26 LAB — HIV ANTIBODY (ROUTINE TESTING W REFLEX): HIV 1&2 Ab, 4th Generation: NONREACTIVE

## 2013-07-26 LAB — GC/CHLAMYDIA PROBE AMP
CT PROBE, AMP APTIMA: NEGATIVE
GC Probe RNA: NEGATIVE

## 2013-07-29 NOTE — ED Provider Notes (Signed)
Medical screening examination/treatment/procedure(s) were conducted as a shared visit with non-physician practitioner(s) and myself.  I personally evaluated the patient during the encounter.   EKG Interpretation None      I interviewed and examined the patient. Lungs are CTAB. Cardiac exam wnl. Abdomen soft.  Papular lesion on shaft of penis w/ flat top and mild excoriation. Doubt that this is a chancre but will tx w/ Benzathine PCN and empirically for GC/Chl as pt is sexually active. Possibly molluscum.   Junius ArgyleForrest S Kymari Nuon, MD 07/29/13 [phone]

## 2013-11-04 ENCOUNTER — Emergency Department (HOSPITAL_COMMUNITY)
Admission: EM | Admit: 2013-11-04 | Discharge: 2013-11-04 | Disposition: A | Discharge status: Home / Self Care | Payer: No Typology Code available for payment source | Attending: Emergency Medicine | Admitting: Emergency Medicine

## 2013-11-04 ENCOUNTER — Encounter (HOSPITAL_COMMUNITY): Payer: Self-pay | Admitting: Emergency Medicine

## 2013-11-04 DIAGNOSIS — R569 Unspecified convulsions: Secondary | ICD-10-CM

## 2013-11-04 DIAGNOSIS — Z87891 Personal history of nicotine dependence: Secondary | ICD-10-CM | POA: Insufficient documentation

## 2013-11-04 LAB — I-STAT CHEM 8, ED
BUN: 8 mg/dL (ref 6–23)
CREATININE: 1.2 mg/dL (ref 0.50–1.35)
Calcium, Ion: 1.18 mmol/L (ref 1.12–1.23)
Chloride: 102 mEq/L (ref 96–112)
Glucose, Bld: 170 mg/dL — ABNORMAL HIGH (ref 70–99)
HCT: 48 % (ref 39.0–52.0)
HEMOGLOBIN: 16.3 g/dL (ref 13.0–17.0)
Potassium: 3.4 mEq/L — ABNORMAL LOW (ref 3.7–5.3)
Sodium: 138 mEq/L (ref 137–147)
TCO2: 16 mmol/L (ref 0–100)

## 2013-11-04 MED ORDER — LEVETIRACETAM 500 MG PO TABS: 500.0000 mg | ORAL_TABLET | Freq: Two times a day (BID) | ORAL | Status: DC

## 2013-11-04 NOTE — ED Notes (Signed)
Discharge instructions and prescription reviewed.  Voiced understanding 

## 2013-11-04 NOTE — ED Provider Notes (Signed)
CSN: [id number]     Arrival date & time 11/04/13  0127 History   First MD Initiated Contact with Patient 11/04/13 0206     Chief Complaint  Patient presents with  . Seizures     (Consider location/radiation/quality/duration/timing/severity/associated sxs/prior Treatment) HPI Comments: 23 year old male past smoker presents after a five-minute tonic-clonic generalized seizure. He was witnessed prior to driving. Patient had one seizure similar in the past however has not seen neurology, had CT head at that time unremarkable reviewed. Patient denies illegal drugs except marijuana. No new medicines. No family history seizures. Patient mild fatigue afterwards however feels well with no significant symptoms. No fevers or recent illness.  Patient is a 23 y.o. male presenting with seizures. The history is provided by the patient.  Seizures   History reviewed. No pertinent past medical history. History reviewed. No pertinent past surgical history. No family history on file. History  Substance Use Topics  . Smoking status: Former Smoker    Types: Cigarettes  . Smokeless tobacco: Not on file  . Alcohol Use: Yes     Comment: ocasionally    Review of Systems  Constitutional: Positive for fatigue. Negative for fever and chills.  HENT: Negative for congestion.   Eyes: Negative for visual disturbance.  Respiratory: Negative for shortness of breath.   Cardiovascular: Negative for chest pain.  Gastrointestinal: Negative for vomiting and abdominal pain.  Genitourinary: Negative for dysuria and flank pain.  Musculoskeletal: Negative for back pain, neck pain and neck stiffness.  Skin: Negative for rash.  Neurological: Positive for seizures. Negative for light-headedness and headaches.      Allergies  Review of patient's allergies indicates no known allergies.  Home Medications   Prior to Admission medications   Not on File   BP 93/57  Pulse 65  Temp(Src) 97.4 F (36.3 C) (Oral)  Resp  17  Ht 5\' 10"  (1.778 m)  Wt 155 lb (70.308 kg)  BMI 22.24 kg/m2  SpO2 98% Physical Exam  Nursing note and vitals reviewed. Constitutional: He is oriented to person, place, and time. He appears well-developed and well-nourished.  HENT:  Head: Normocephalic and atraumatic.  Eyes: Conjunctivae are normal. Right eye exhibits no discharge. Left eye exhibits no discharge.  Neck: Normal range of motion. Neck supple. No tracheal deviation present.  Cardiovascular: Normal rate and regular rhythm.   Pulmonary/Chest: Effort normal and breath sounds normal.  Abdominal: Soft. He exhibits no distension. There is no tenderness. There is no guarding.  Musculoskeletal: He exhibits no edema.  Neurological: He is alert and oriented to person, place, and time.  5+ strength in UE and LE with f/e at major joints. Sensation to palpation intact in UE and LE. CNs 2-12 grossly intact.  EOMFI.  PERRL.   Finger nose and coordination intact bilateral.   Visual fields intact to finger testing.   Skin: Skin is warm. No rash noted.  Psychiatric: He has a normal mood and affect.    ED Course  Procedures (including critical care time) Labs Review Labs Reviewed  I-STAT CHEM 8, ED - Abnormal; Notable for the following:    Potassium 3.4 (*)    Glucose, Bld 170 (*)    All other components within normal limits    Imaging Review No results found.   EKG Interpretation None      MDM   Final diagnoses:  Seizures   Patient with second a seizure concern for seizure disorder/epilepsy. Patient had CT head last time and normal neuro exam and  no concerns at this time. Neurology paged to discuss starting seizure meds. Discuss with neurologist on call who recommended starting Keppra and outpatient followup. No seizures in ER.  Results and differential diagnosis were discussed with the patient/parent/guardian. Close follow up outpatient was discussed, comfortable with the plan.   Medications - No data to  display  Filed Vitals:   11/04/13 0300 11/04/13 0315 11/04/13 0345 11/04/13 0400  BP: 94/53 101/60 93/59 101/58  Pulse: 65 71 66 63  Temp:      TempSrc:      Resp: 21 15 7 12   Height:      Weight:      SpO2: 98% 98% 99% 99%        Enid Skeens, MD 11/04/13 [phone]

## 2013-11-04 NOTE — ED Notes (Signed)
Pt to ED via [facility] from home.  Pt had witness seizure lasting approximately 2 minutes- upon arrival to ED pt alert and oriented X 4, admits to having a headache 8/10, pt also admits to nausea- EMS administered 4mg  Zofran.  Hx of 1 seizure that happened about 7 months ago.  CBG 140, BP 108/58.  Pt reports he smoked marijuana prior to having seizure.

## 2013-11-04 NOTE — Discharge Instructions (Signed)
If you were given medicines take as directed.  If you are on coumadin or contraceptives realize their levels and effectiveness is altered by many different medicines.  If you have any reaction (rash, tongues swelling, other) to the medicines stop taking and see a physician.   No driving or operating machinery until cleared by neurology, no swimming in lakes or alone until cleared.  Please follow up as directed and return to the ER or see a physician for new or worsening symptoms.  Thank you. Filed Vitals:   11/04/13 0300 11/04/13 0315 11/04/13 0345 11/04/13 0400  BP: 94/53 101/60 93/59 101/58  Pulse: 65 71 66 63  Temp:      TempSrc:      Resp: 21 15 7 12   Height:      Weight:      SpO2: 98% 98% 99% 99%    Epilepsy Epilepsy is a disorder in which a person has repeated seizures over time. A seizure is a release of abnormal electrical activity in the brain. Seizures can cause a change in attention, behavior, or the ability to remain awake and alert (altered mental status). Seizures often involve uncontrollable shaking (convulsions).  Most people with epilepsy lead normal lives. However, people with epilepsy are at an increased risk of falls, accidents, and injuries. Therefore, it is important to begin treatment right away. CAUSES  Epilepsy has many possible causes. Anything that disturbs the normal pattern of brain cell activity can lead to seizures. This may include:   Head injury.  Birth trauma.  High fever as a child.  Stroke.  Bleeding into or around the brain.  Certain drugs.  Prolonged low oxygen, such as what occurs after CPR efforts.  Abnormal brain development.  Certain illnesses, such as meningitis, encephalitis (brain infection), malaria, and other infections.  An imbalance of nerve signaling chemicals (neurotransmitters).  SIGNS AND SYMPTOMS  The symptoms of a seizure can vary greatly from one person to another. Right before a seizure, you may have a warning (aura)  that a seizure is about to occur. An aura may include the following symptoms:  Fear or anxiety.  Nausea.  Feeling like the room is spinning (vertigo).  Vision changes, such as seeing flashing lights or spots. Common symptoms during a seizure include:  Abnormal sensations, such as an abnormal smell or a bitter taste in the mouth.   Sudden, general body stiffness.   Convulsions that involve rhythmic jerking of the face, arm, or leg on one or both sides.   Sudden change in consciousness.   Appearing to be awake but not responding.   Appearing to be asleep but cannot be awakened.   Grimacing, chewing, lip smacking, drooling, tongue biting, or loss of bowel or bladder control. After a seizure, you may feel sleepy for a while. DIAGNOSIS  Your health care provider will ask about your symptoms and take a medical history. Descriptions from any witnesses to your seizures will be very helpful in the diagnosis. A physical exam, including a detailed neurological exam, is necessary. Various tests may be done, such as:   An electroencephalogram (EEG). This is a painless test of your brain waves. In this test, a diagram is created of your brain waves. These diagrams can be interpreted by a specialist.  An MRI of the brain.   A CT scan of the brain.   A spinal tap (lumbar puncture, LP).  Blood tests to check for signs of infection or abnormal blood chemistry. TREATMENT  There is  no cure for epilepsy, but it is generally treatable. Once epilepsy is diagnosed, it is important to begin treatment as soon as possible. For most people with epilepsy, seizures can be controlled with medicines. The following may also be used:  A pacemaker for the brain (vagus nerve stimulator) can be used for people with seizures that are not well controlled by medicine.  Surgery on the brain. For some people, epilepsy eventually goes away. HOME CARE INSTRUCTIONS   Follow your health care provider's  recommendations on driving and safety in normal activities.  Get enough rest. Lack of sleep can cause seizures.  Only take over-the-counter or prescription medicines as directed by your health care provider. Take any prescribed medicine exactly as directed.  Avoid any known triggers of your seizures.  Keep a seizure diary. Record what you recall about any seizure, especially any possible trigger.   Make sure the people you live and work with know that you are prone to seizures. They should receive instructions on how to help you. In general, a witness to a seizure should:   Cushion your head and body.   Turn you on your side.   Avoid unnecessarily restraining you.   Not place anything inside your mouth.   Call for emergency medical help if there is any question about what has occurred.   Follow up with your health care provider as directed. You may need regular blood tests to monitor the levels of your medicine.  SEEK MEDICAL CARE IF:   You develop signs of infection or other illness. This might increase the risk of a seizure.   You seem to be having more frequent seizures.   Your seizure pattern is changing.  SEEK IMMEDIATE MEDICAL CARE IF:   You have a seizure that does not stop after a few moments.   You have a seizure that causes any difficulty in breathing.   You have a seizure that results in a very severe headache.   You have a seizure that leaves you with the inability to speak or use a part of your body.  Document Released: 03/29/2005 Document Revised: 01/17/2013 Document Reviewed: 11/08/2012 [facility] Patient Information 2015 [place], [place]. This information is not intended to replace advice given to you by your health care provider. Make sure you discuss any questions you have with your health care provider.

## 2014-02-03 ENCOUNTER — Encounter (HOSPITAL_COMMUNITY): Payer: Self-pay | Admitting: Emergency Medicine

## 2014-02-03 ENCOUNTER — Emergency Department (HOSPITAL_COMMUNITY)
Admission: EM | Admit: 2014-02-03 | Discharge: 2014-02-03 | Disposition: A | Discharge status: Home / Self Care | Payer: No Typology Code available for payment source | Attending: Emergency Medicine | Admitting: Emergency Medicine

## 2014-02-03 DIAGNOSIS — Z79899 Other long term (current) drug therapy: Secondary | ICD-10-CM | POA: Insufficient documentation

## 2014-02-03 DIAGNOSIS — Z87891 Personal history of nicotine dependence: Secondary | ICD-10-CM | POA: Insufficient documentation

## 2014-02-03 DIAGNOSIS — Z9114 Patient's other noncompliance with medication regimen: Secondary | ICD-10-CM | POA: Insufficient documentation

## 2014-02-03 DIAGNOSIS — G40909 Epilepsy, unspecified, not intractable, without status epilepticus: Secondary | ICD-10-CM | POA: Insufficient documentation

## 2014-02-03 LAB — CBC
HEMATOCRIT: 42.7 % (ref 39.0–52.0)
HEMOGLOBIN: 14.2 g/dL (ref 13.0–17.0)
MCH: 29 pg (ref 26.0–34.0)
MCHC: 33.3 g/dL (ref 30.0–36.0)
MCV: 87.3 fL (ref 78.0–100.0)
Platelets: 240 10*3/uL (ref 150–400)
RBC: 4.89 MIL/uL (ref 4.22–5.81)
RDW: 13.3 % (ref 11.5–15.5)
WBC: 9.9 10*3/uL (ref 4.0–10.5)

## 2014-02-03 LAB — RAPID URINE DRUG SCREEN, HOSP PERFORMED
Amphetamines: NOT DETECTED
BARBITURATES: NOT DETECTED
Benzodiazepines: NOT DETECTED
COCAINE: NOT DETECTED
Opiates: NOT DETECTED
Tetrahydrocannabinol: POSITIVE — AB

## 2014-02-03 LAB — BASIC METABOLIC PANEL
Anion gap: 32 — ABNORMAL HIGH (ref 5–15)
BUN: 7 mg/dL (ref 6–23)
CHLORIDE: 94 meq/L — AB (ref 96–112)
CO2: 11 mEq/L — ABNORMAL LOW (ref 19–32)
Calcium: 9.6 mg/dL (ref 8.4–10.5)
Creatinine, Ser: 0.99 mg/dL (ref 0.50–1.35)
GFR calc Af Amer: >90 mL/min (ref >90)
GFR calc non Af Amer: >90 mL/min (ref >90)
GLUCOSE: 184 mg/dL — AB (ref 70–99)
POTASSIUM: 3.7 meq/L (ref 3.7–5.3)
Sodium: 137 mEq/L (ref 137–147)

## 2014-02-03 MED ORDER — LEVETIRACETAM IN NACL 500 MG/100ML IV SOLN
500.0000 mg | Freq: Once | INTRAVENOUS | Status: AC
  Administered 2014-02-03: 500 mg via INTRAVENOUS
  Filled 2014-02-03: qty 100

## 2014-02-03 MED ORDER — SODIUM CHLORIDE 0.9 % IV BOLUS (SEPSIS)
1000.0000 mL | Freq: Once | INTRAVENOUS | Status: AC
  Administered 2014-02-03: 1000 mL via INTRAVENOUS

## 2014-02-03 MED ORDER — LEVETIRACETAM 500 MG PO TABS: 500.0000 mg | ORAL_TABLET | Freq: Two times a day (BID) | ORAL | Status: DC

## 2014-02-03 NOTE — ED Notes (Signed)
Patient medicated, see MAR Patient texting on cell phone and talking with visitors at bedside Patient remains in NAD

## 2014-02-03 NOTE — ED Notes (Signed)
MD at bedside. 

## 2014-02-03 NOTE — ED Notes (Signed)
Bed: WA16 Expected date:  Expected time:  Means of arrival:  Comments: EMS seizure 

## 2014-02-03 NOTE — ED Notes (Addendum)
Patient arrives via [facility] due to seizure-like activity while shopping Patient states that this has occurred before when he has not had anything to eat--denies DM Per EMS, patient in the parking lot and felt weak and sat down on the ground NO LOC, did not hit head Patient smells strongly of marijuana and appears intoxicated Patient alert and oriented x 4 Zofran 4 mg IV given to patient by EMS for c/o nausea

## 2014-02-03 NOTE — ED Provider Notes (Signed)
CSN: [id number]     Arrival date & time 02/03/14  1608 History   First MD Initiated Contact with Patient 02/03/14 1623     Chief Complaint  Patient presents with  . Seizures     (Consider location/radiation/quality/duration/timing/severity/associated sxs/prior Treatment) HPI Patient states this is his third episode of a syncopal type episode that started about 4 months ago.  He states the first time which reviewing his chart was in June 2014 he was in his grandmother's house and they heard when his head hit the wall.  He fell to the floor and had some shaking movements.  The second episode was in July 2015 when he was sitting in his car getting ready to pull out his driveway.  He states his cousin states he got stiff all over.  He was started on Keppra however when the prescription ran out he stopped taking it and did not get a PCP.  He states today he was going into the mall and he felt nauseated.  The next thing he knew he was on the ground.  He states after these episodes he has a short period of confusion.  He states he has a mild diffuse headache now.  He denies vomiting but states he was still nauseated while in the ambulance.  He denies any injury today. He has not been evaluated by neurology or had an EEG. He had a negative head CT scan in 2014.  He denies any hx of head injury.   PCP none  History reviewed. No pertinent past medical history. History reviewed. No pertinent past surgical history. History reviewed. No pertinent family history. History  Substance Use Topics  . Smoking status: Former Smoker    Types: Cigarettes  . Smokeless tobacco: Not on file  . Alcohol Use: Yes     Comment: ocasionally  employed as a Scientist, [employer] Drank last night and on weekends Uses vapes  Review of Systems  All other systems reviewed and are negative.     Allergies  Review of patient's allergies indicates no known allergies.  Home Medications   Prior to Admission medications    Medication Sig Start Date End Date Taking? Authorizing Provider  ibuprofen (ADVIL,MOTRIN) 200 MG tablet Take 200-400 mg by mouth daily as needed for headache.   Yes Historical Provider, MD  levETIRAcetam (KEPPRA) 500 MG tablet Take 1 tablet (500 mg total) by mouth 2 (two) times daily. 11/04/13  Yes Enid SkeensJoshua M Zavitz, MD   BP 95/50  Pulse 59  Temp(Src) 98.4 F (36.9 C) (Oral)  Resp 17  SpO2 99%  Vital signs normal except hypotension, bradycardia  Physical Exam  Nursing note and vitals reviewed. Constitutional: He is oriented to person, place, and time. He appears well-developed and well-nourished.  Non-toxic appearance. He does not appear ill. No distress.  HENT:  Head: Normocephalic and atraumatic.  Right Ear: External ear normal.  Left Ear: External ear normal.  Nose: Nose normal. No mucosal edema or rhinorrhea.  Mouth/Throat: Oropharynx is clear and moist and mucous membranes are normal. No dental abscesses or uvula swelling.  No trauma to tongue  Eyes: Conjunctivae and EOM are normal. Pupils are equal, round, and reactive to light.  Neck: Normal range of motion and full passive range of motion without pain. Neck supple.  Cardiovascular: Normal rate, regular rhythm and normal heart sounds.  Exam reveals no gallop and no friction rub.   No murmur heard. Pulmonary/Chest: Effort normal and breath sounds normal. No respiratory distress. He has  no wheezes. He has no rhonchi. He has no rales. He exhibits no tenderness and no crepitus.  Abdominal: Soft. Normal appearance and bowel sounds are normal. He exhibits no distension. There is no tenderness. There is no rebound and no guarding.  Musculoskeletal: Normal range of motion. He exhibits no edema and no tenderness.  Moves all extremities well.   Neurological: He is alert and oriented to person, place, and time. He has normal strength. No cranial nerve deficit.  Skin: Skin is warm, dry and intact. No rash noted. No erythema. No pallor.   Psychiatric: He has a normal mood and affect. His speech is normal and behavior is normal. His mood appears not anxious.    ED Course  Procedures (including critical care time)  Medications  sodium chloride 0.9 % bolus 1,000 mL (0 mLs Intravenous Stopped 02/03/14 1710)  sodium chloride 0.9 % bolus 1,000 mL (0 mLs Intravenous Stopped 02/03/14 1824)  levETIRAcetam (KEPPRA) IVPB 500 mg/100 mL premix (500 mg Intravenous Given 02/03/14 1743)    17:50 friends here and describe seizure like activity lasting at least 5 minutes and post-ictal state afterwards.  We discussed no driving, climbing ladders and that he needs to stay on the ground at work.   Labs Review Results for orders placed during the hospital encounter of 02/03/14  URINE RAPID DRUG SCREEN (HOSP PERFORMED)      Result Value Ref Range   Opiates NONE DETECTED  NONE DETECTED   Cocaine NONE DETECTED  NONE DETECTED   Benzodiazepines NONE DETECTED  NONE DETECTED   Amphetamines NONE DETECTED  NONE DETECTED   Tetrahydrocannabinol POSITIVE (*) NONE DETECTED   Barbiturates NONE DETECTED  NONE DETECTED  CBC      Result Value Ref Range   WBC 9.9  4.0 - 10.5 K/uL   RBC 4.89  4.22 - 5.81 MIL/uL   Hemoglobin 14.2  13.0 - 17.0 g/dL   HCT 16.142.7  09.639.0 - 04.552.0 %   MCV 87.3  78.0 - 100.0 fL   MCH 29.0  26.0 - 34.0 pg   MCHC 33.3  30.0 - 36.0 g/dL   RDW 40.913.3  81.111.5 - 91.415.5 %   Platelets 240  150 - 400 K/uL  BASIC METABOLIC PANEL      Result Value Ref Range   Sodium 137  137 - 147 mEq/L   Potassium 3.7  3.7 - 5.3 mEq/L   Chloride 94 (*) 96 - 112 mEq/L   CO2 11 (*) 19 - 32 mEq/L   Glucose, Bld 184 (*) 70 - 99 mg/dL   BUN 7  6 - 23 mg/dL   Creatinine, Ser 7.820.99  0.50 - 1.35 mg/dL   Calcium 9.6  8.4 - 95.610.5 mg/dL   GFR calc non Af Amer >90  >90 mL/min   GFR calc Af Amer >90  >90 mL/min   Anion gap 32 (*) 5 - 15   Laboratory interpretation all normal except +UDS, + AG c/w recent seizure     Imaging Review No results found.   EKG  Interpretation None      MDM   Final diagnoses:  Seizure disorder  Noncompliance with medications   New Prescriptions   LEVETIRACETAM (KEPPRA) 500 MG TABLET    Take 1 tablet (500 mg total) by mouth 2 (two) times daily.    Plan discharge   Devoria AlbeIva Zyeir Dymek, MD, Franz DellFACEP      Linea Calles L Parrie Rasco, MD 02/03/14 [phone]

## 2014-02-03 NOTE — ED Notes (Signed)
Patient seen in ED in July for same complaint--Seizure like activity after smoking marijuana

## 2014-02-03 NOTE — Discharge Instructions (Signed)
Take the keppra twice a day. NO DRIVING, CLIMBING LADDERS or any activity that you could get hurt if you have another seizure.  Call [facility] to get an appointment with the neurologist to finish your evaluation and to be maintained on your medications.    Driving and Equipment Restrictions Some medical problems make it dangerous to drive, ride a bike, or use machines. Some of these problems are:  A hard blow to the head (concussion).  Passing out (fainting).  Twitching and shaking (seizures).  Low blood sugar.  Taking medicine to help you relax (sedatives).  Taking pain medicines.  Wearing an eye patch.  Wearing splints. This can make it hard to use parts of your body that you need to drive safely. HOME CARE   Do not drive until your doctor says it is okay.  Do not use machines until your doctor says it is okay. You may need a form signed by your doctor (medical release) before you can drive again. You may also need this form before you do other tasks where you need to be fully alert. MAKE SURE YOU:  Understand these instructions.  Will watch your condition.  Will get help right away if you are not doing well or get worse. Document Released: 05/06/2004 Document Revised: 06/21/2011 Document Reviewed: 08/06/2009 [facility] Patient Information 2015 [employer], [place]. This information is not intended to replace advice given to you by your health care provider. Make sure you discuss any questions you have with your health care provider.  Epilepsy People with epilepsy have times when they shake and jerk uncontrollably (seizures). This happens when there is a sudden change in brain function. Epilepsy may have many possible causes. Anything that disturbs the normal pattern of brain cell activity can lead to seizures. HOME CARE   Follow your doctor's instructions about driving and safety during normal activities.  Get enough sleep.  Only take medicine as told by your  doctor.  Avoid things that you know can cause you to have seizures (triggers).  Write down when your seizures happen and what you remember about each seizure. Write down anything you think may have caused the seizure to happen.  Tell the people you live and work with that you have seizures. Make sure they know how to help you. They should:  Cushion your head and body.  Turn you on your side.  Not restrain you.  Not place anything inside your mouth.  Call for local emergency medical help if there is any question about what has happened.  Keep all follow-up visits with your doctor. This is very important. GET HELP IF:  You get an infection or start to feel sick. You may have more seizures when you are sick.  You are having seizures more often.  Your seizure pattern is changing. GET HELP RIGHT AWAY IF:   A seizure does not stop after a few seconds or minutes.  A seizure causes you to have trouble breathing.  A seizure gives you a very bad headache.  A seizure makes you unable to speak or use a part of your body. Document Released: 01/24/2009 Document Revised: 01/17/2013 Document Reviewed: 11/08/2012 [facility] Patient Information 2015 [place], [place]. This information is not intended to replace advice given to you by your health care provider. Make sure you discuss any questions you have with your health care provider.

## 2014-09-02 ENCOUNTER — Emergency Department (HOSPITAL_COMMUNITY)
Admission: EM | Admit: 2014-09-02 | Discharge: 2014-09-02 | Discharge status: Against Medical Advice | Payer: Self-pay | Attending: Emergency Medicine | Admitting: Emergency Medicine

## 2014-09-02 DIAGNOSIS — R109 Unspecified abdominal pain: Secondary | ICD-10-CM | POA: Insufficient documentation

## 2015-06-07 ENCOUNTER — Emergency Department (HOSPITAL_COMMUNITY)
Admission: EM | Admit: 2015-06-07 | Discharge: 2015-06-07 | Disposition: A | Discharge status: Home / Self Care | Payer: No Typology Code available for payment source | Attending: Emergency Medicine | Admitting: Emergency Medicine

## 2015-06-07 ENCOUNTER — Encounter (HOSPITAL_COMMUNITY): Payer: Self-pay | Admitting: *Deleted

## 2015-06-07 DIAGNOSIS — F1290 Cannabis use, unspecified, uncomplicated: Secondary | ICD-10-CM | POA: Insufficient documentation

## 2015-06-07 DIAGNOSIS — M791 Myalgia: Secondary | ICD-10-CM | POA: Insufficient documentation

## 2015-06-07 DIAGNOSIS — R05 Cough: Secondary | ICD-10-CM | POA: Insufficient documentation

## 2015-06-07 DIAGNOSIS — Z79899 Other long term (current) drug therapy: Secondary | ICD-10-CM | POA: Insufficient documentation

## 2015-06-07 DIAGNOSIS — R112 Nausea with vomiting, unspecified: Secondary | ICD-10-CM | POA: Insufficient documentation

## 2015-06-07 DIAGNOSIS — R6889 Other general symptoms and signs: Secondary | ICD-10-CM

## 2015-06-07 DIAGNOSIS — Z87891 Personal history of nicotine dependence: Secondary | ICD-10-CM | POA: Insufficient documentation

## 2015-06-07 DIAGNOSIS — J029 Acute pharyngitis, unspecified: Secondary | ICD-10-CM

## 2015-06-07 LAB — RAPID STREP SCREEN (MED CTR MEBANE ONLY): Streptococcus, Group A Screen (Direct): NEGATIVE

## 2015-06-07 MED ORDER — ONDANSETRON 4 MG PO TBDP
4.0000 mg | ORAL_TABLET | Freq: Once | ORAL | Status: AC
  Administered 2015-06-07: 4 mg via ORAL
  Filled 2015-06-07: qty 1

## 2015-06-07 MED ORDER — OSELTAMIVIR PHOSPHATE 75 MG PO CAPS: 75.0000 mg | ORAL_CAPSULE | Freq: Two times a day (BID) | ORAL | Status: AC

## 2015-06-07 MED ORDER — ONDANSETRON 4 MG PO TBDP: 4.0000 mg | ORAL_TABLET | Freq: Three times a day (TID) | ORAL | Status: DC | PRN

## 2015-06-07 MED ORDER — ACETAMINOPHEN 325 MG PO TABS
650.0000 mg | ORAL_TABLET | Freq: Once | ORAL | Status: AC | PRN
  Administered 2015-06-07: 650 mg via ORAL
  Filled 2015-06-07: qty 2

## 2015-06-07 MED ORDER — DEXAMETHASONE SODIUM PHOSPHATE 10 MG/ML IJ SOLN
10.0000 mg | Freq: Once | INTRAMUSCULAR | Status: AC
  Administered 2015-06-07: 10 mg via INTRAVENOUS
  Filled 2015-06-07: qty 1

## 2015-06-07 MED ORDER — IBUPROFEN 600 MG PO TABS: 600.0000 mg | ORAL_TABLET | Freq: Four times a day (QID) | ORAL | Status: DC | PRN

## 2015-06-07 MED ORDER — PENICILLIN G BENZATHINE 1200000 UNIT/2ML IM SUSP
1.2000 10*6.[IU] | Freq: Once | INTRAMUSCULAR | Status: AC
  Administered 2015-06-07: 1.2 10*6.[IU] via INTRAMUSCULAR
  Filled 2015-06-07: qty 2

## 2015-06-07 NOTE — ED Provider Notes (Signed)
CSN: [id number]     Arrival date & time 06/07/15  0221 History  By signing my name below, I, Chris Krause, attest that this documentation has been prepared under the direction and in the presence of Chris Baton, MD. Electronically Signed: Doreatha Krause, ED Scribe. 06/07/2015. 3:45 AM.    Chief Complaint  Patient presents with  . Influenza   The history is provided by the patient. No language interpreter was used.    HPI Comments: Chris Krause is a 25 y.o. male otherwise healthy who presents to the Emergency Department complaining of moderate, intermittent fever onset 2 days ago with associated sore throat, generalized myalgias, nausea, emesis, cough. Pt states recent sick contact with his younger brother who had a positive flu test 2 days ago. Pt notes that he has been attempting to maintain adequate fluid intake with some success. Pt notes that he has been taking Dayquil with no relief of symptoms. NKDA. Pt endorses marijuana use.    History reviewed. No pertinent past medical history. History reviewed. No pertinent past surgical history. No family history on file. Social History  Substance Use Topics  . Smoking status: Former Smoker    Types: Cigarettes  . Smokeless tobacco: None  . Alcohol Use: Yes     Comment: ocasionally    Review of Systems  Constitutional: Positive for fever and chills.  HENT: Positive for sore throat.   Respiratory: Positive for cough.   Cardiovascular: Negative for chest pain.  Gastrointestinal: Positive for nausea and vomiting. Negative for abdominal pain.  Musculoskeletal: Positive for myalgias.  All other systems reviewed and are negative.  Allergies  Review of patient's allergies indicates no known allergies.  Home Medications   Prior to Admission medications   Medication Sig Start Date End Date Taking? Authorizing Provider  ibuprofen (ADVIL,MOTRIN) 600 MG tablet Take 1 tablet (600 mg total) by mouth every 6 (six) hours as needed. 06/07/15    Chris Baton, MD  levETIRAcetam (KEPPRA) 500 MG tablet Take 1 tablet (500 mg total) by mouth 2 (two) times daily. 11/04/13   Blane Ohara, MD  levETIRAcetam (KEPPRA) 500 MG tablet Take 1 tablet (500 mg total) by mouth 2 (two) times daily. 02/03/14   Devoria Albe, MD  ondansetron (ZOFRAN ODT) 4 MG disintegrating tablet Take 1 tablet (4 mg total) by mouth every 8 (eight) hours as needed for nausea or vomiting. 06/07/15   Chris Baton, MD  oseltamivir (TAMIFLU) 75 MG capsule Take 1 capsule (75 mg total) by mouth every 12 (twelve) hours. 06/07/15   Chris Baton, MD   BP 103/67 mmHg  Pulse 79  Temp(Src) 98.5 F (36.9 C) (Oral)  Resp 20  SpO2 92% Physical Exam  Constitutional: He is oriented to person, place, and time. He appears well-developed and well-nourished.  HENT:  Head: Normocephalic and atraumatic.  Mouth/Throat: Oropharyngeal exudate present.  Bilateral tonsillar enlargement, uvula midline, tonsillar exudate noted, palatal petechiae, no trismus  Eyes: Pupils are equal, round, and reactive to light.  Neck: Normal range of motion. Neck supple.  Cardiovascular: Normal rate, regular rhythm and normal heart sounds.   No murmur heard. Pulmonary/Chest: Effort normal and breath sounds normal. No respiratory distress. He has no wheezes.  Abdominal: Soft. Bowel sounds are normal. There is no tenderness. There is no rebound.  Musculoskeletal: He exhibits no edema.  Lymphadenopathy:    He has cervical adenopathy.  Neurological: He is alert and oriented to person, place, and time.  Skin: Skin is warm  and dry.  Psychiatric: He has a normal mood and affect.  Nursing note and vitals reviewed.   ED Course  Procedures (including critical care time) DIAGNOSTIC STUDIES: Oxygen Saturation is 99% on RA, normal by my interpretation.    COORDINATION OF CARE: 3:43 AM Discussed treatment plan with pt at bedside and pt agreed to plan.   Labs Review Labs Reviewed  RAPID STREP SCREEN (NOT  AT [facility])  CULTURE, GROUP A STREP [facility])   I have personally reviewed and evaluated these lab results as part of my medical decision-making.   MDM   Final diagnoses:  Flu-like symptoms  Pharyngitis    Patient presents with flulike symptoms. Fever, cough, sore throat. Reports that he has had a known flu contact. He has had 2 days of symptoms. He has evidence of impressive pharyngitis concerning for possible strep pharyngitis. He is Centor criteria 3 out of 4. While his strep screen was negative, he was empirically treated with Bicillin and given Decadron for his tonsillar swelling. He was also given a liter of fluids and Tylenol. His fever improved. Discussed with the patient in. Treatment with Tamiflu given known contact. He is able to tolerate fluids without difficulty.  After history, exam, and medical workup I feel the patient has been appropriately medically screened and is safe for discharge home. Pertinent diagnoses were discussed with the patient. Patient was given return precautions.  I personally performed the services described in this documentation, which was scribed in my presence. The recorded information has been reviewed and is accurate.   Chris Baton, MD 06/07/15 [phone]

## 2015-06-07 NOTE — ED Notes (Signed)
Pt states he has had fever, chills, body aches, sore throat x 2 days. States his brother was diagnosed with the flu.

## 2015-06-07 NOTE — ED Notes (Signed)
Pt encouraged to take PO fluid, pt states he is unable to take fluids now due to been painful to swallow. MD to be notified.

## 2015-06-07 NOTE — Discharge Instructions (Signed)
You were seen today for flulike illnesses.  You also have evidence of pharyngitis. Her strep screen was negative but you were treated for strep given your symptoms. He will also be given Tamiflu given your known sick contact. Hydration is very important. Tylenol for pain.  Pharyngitis Pharyngitis is redness, pain, and swelling (inflammation) of your pharynx.  CAUSES  Pharyngitis is usually caused by infection. Most of the time, these infections are from viruses (viral) and are part of a cold. However, sometimes pharyngitis is caused by bacteria (bacterial). Pharyngitis can also be caused by allergies. Viral pharyngitis may be spread from person to person by coughing, sneezing, and personal items or utensils (cups, forks, spoons, toothbrushes). Bacterial pharyngitis may be spread from person to person by more intimate contact, such as kissing.  SIGNS AND SYMPTOMS  Symptoms of pharyngitis include:   Sore throat.   Tiredness (fatigue).   Low-grade fever.   Headache.  Joint pain and muscle aches.  Skin rashes.  Swollen lymph nodes.  Plaque-like film on throat or tonsils (often seen with bacterial pharyngitis). DIAGNOSIS  Your health care provider will ask you questions about your illness and your symptoms. Your medical history, along with a physical exam, is often all that is needed to diagnose pharyngitis. Sometimes, a rapid strep test is done. Other lab tests may also be done, depending on the suspected cause.  TREATMENT  Viral pharyngitis will usually get better in 3-4 days without the use of medicine. Bacterial pharyngitis is treated with medicines that kill germs (antibiotics).  HOME CARE INSTRUCTIONS   Drink enough water and fluids to keep your urine clear or pale yellow.   Only take over-the-counter or prescription medicines as directed by your health care provider:   If you are prescribed antibiotics, make sure you finish them even if you start to feel better.   Do not  take aspirin.   Get lots of rest.   Gargle with 8 oz of salt water ( tsp of salt per 1 qt of water) as often as every 1-2 hours to soothe your throat.   Throat lozenges (if you are not at risk for choking) or sprays may be used to soothe your throat. SEEK MEDICAL CARE IF:   You have large, tender lumps in your neck.  You have a rash.  You cough up green, yellow-brown, or bloody spit. SEEK IMMEDIATE MEDICAL CARE IF:   Your neck becomes stiff.  You drool or are unable to swallow liquids.  You vomit or are unable to keep medicines or liquids down.  You have severe pain that does not go away with the use of recommended medicines.  You have trouble breathing (not caused by a stuffy nose). MAKE SURE YOU:   Understand these instructions.  Will watch your condition.  Will get help right away if you are not doing well or get worse.   This information is not intended to replace advice given to you by your health care provider. Make sure you discuss any questions you have with your health care provider.   Document Released: 03/29/2005 Document Revised: 01/17/2013 Document Reviewed: 12/04/2012 Elsevier Interactive Patient Education 2016 [employer] Viral Infections A virus is a type of germ. Viruses can cause:  Minor sore throats.  Aches and pains.  Headaches.  Runny nose.  Rashes.  Watery eyes.  Tiredness.  Coughs.  Loss of appetite.  Feeling sick to your stomach (nausea).  Throwing up (vomiting).  Watery poop (diarrhea). HOME CARE   Only  take medicines as told by your doctor.  Drink enough water and fluids to keep your pee (urine) clear or pale yellow. Sports drinks are a good choice.  Get plenty of rest and eat healthy. Soups and broths with crackers or rice are fine. GET HELP RIGHT AWAY IF:   You have a very bad headache.  You have shortness of breath.  You have chest pain or neck pain.  You have an unusual rash.  You cannot stop throwing  up.  You have watery poop that does not stop.  You cannot keep fluids down.  You or your child has a temperature by mouth above 102 F (38.9 C), not controlled by medicine.  Your baby is older than 3 months with a rectal temperature of 102 F (38.9 C) or higher.  Your baby is 35 months old or younger with a rectal temperature of 100.4 F (38 C) or higher. MAKE SURE YOU:   Understand these instructions.  Will watch this condition.  Will get help right away if you are not doing well or get worse.   This information is not intended to replace advice given to you by your health care provider. Make sure you discuss any questions you have with your health care provider.   Document Released: 03/11/2008 Document Revised: 06/21/2011 Document Reviewed: 09/04/2014 Elsevier Interactive Patient Education [employer]

## 2015-06-09 LAB — CULTURE, GROUP A STREP (THRC)

## 2015-06-28 ENCOUNTER — Encounter (HOSPITAL_COMMUNITY): Payer: Self-pay | Admitting: Emergency Medicine

## 2015-06-28 ENCOUNTER — Emergency Department (HOSPITAL_COMMUNITY)
Admission: EM | Admit: 2015-06-28 | Discharge: 2015-06-28 | Disposition: A | Discharge status: Home / Self Care | Payer: No Typology Code available for payment source | Attending: Emergency Medicine | Admitting: Emergency Medicine

## 2015-06-28 DIAGNOSIS — Z79899 Other long term (current) drug therapy: Secondary | ICD-10-CM | POA: Insufficient documentation

## 2015-06-28 DIAGNOSIS — K029 Dental caries, unspecified: Secondary | ICD-10-CM | POA: Insufficient documentation

## 2015-06-28 DIAGNOSIS — H9191 Unspecified hearing loss, right ear: Secondary | ICD-10-CM | POA: Insufficient documentation

## 2015-06-28 DIAGNOSIS — H9202 Otalgia, left ear: Secondary | ICD-10-CM | POA: Insufficient documentation

## 2015-06-28 DIAGNOSIS — Z87891 Personal history of nicotine dependence: Secondary | ICD-10-CM | POA: Insufficient documentation

## 2015-06-28 MED ORDER — PENICILLIN V POTASSIUM 500 MG PO TABS: 500.0000 mg | ORAL_TABLET | Freq: Once | ORAL | Status: DC

## 2015-06-28 MED ORDER — IBUPROFEN 400 MG PO TABS: 400.0000 mg | ORAL_TABLET | Freq: Four times a day (QID) | ORAL | Status: AC | PRN

## 2015-06-28 MED ORDER — TRAMADOL HCL 50 MG PO TABS: 50.0000 mg | ORAL_TABLET | Freq: Four times a day (QID) | ORAL | Status: AC | PRN

## 2015-06-28 MED ORDER — PENICILLIN V POTASSIUM 500 MG PO TABS: 500.0000 mg | ORAL_TABLET | Freq: Four times a day (QID) | ORAL | Status: AC

## 2015-06-28 NOTE — ED Notes (Signed)
Patient here from home with complaints of bilateral ear pain x3 days. Reports pain started in right ear. Denies n/v.

## 2015-06-28 NOTE — Discharge Instructions (Signed)
Dental Caries °Dental caries (also called tooth decay) is the most common oral disease. It can occur at any age but is more common in children and young adults.  °HOW DENTAL CARIES DEVELOPS  °The process of decay begins when bacteria and foods (particularly sugars and starches) combine in your mouth to produce plaque. Plaque is a substance that sticks to the hard, outer surface of a tooth (enamel). The bacteria in plaque produce acids that attack enamel. These acids may also attack the root surface of a tooth (cementum) if it is exposed. Repeated attacks dissolve these surfaces and create holes in the tooth (cavities). If left untreated, the acids destroy the other layers of the tooth.  °RISK FACTORS °· Frequent sipping of sugary beverages.   °· Frequent snacking on sugary and starchy foods, especially those that easily get stuck in the teeth.   °· Poor oral hygiene.   °· Dry mouth.   °· Substance abuse such as methamphetamine abuse.   °· Broken or poor-fitting dental restorations.   °· Eating disorders.   °· Gastroesophageal reflux disease (GERD).   °· Certain radiation treatments to the head and neck. °SYMPTOMS °In the early stages of dental caries, symptoms are seldom present. Sometimes white, chalky areas may be seen on the enamel or other tooth layers. In later stages, symptoms may include: °· Pits and holes on the enamel. °· Toothache after sweet, hot, or cold foods or drinks are consumed. °· Pain around the tooth. °· Swelling around the tooth. °DIAGNOSIS  °Most of the time, dental caries is detected during a regular dental checkup. A diagnosis is made after a thorough medical and dental history is taken and the surfaces of your teeth are checked for signs of dental caries. Sometimes special instruments, such as lasers, are used to check for dental caries. Dental X-ray exams may be taken so that areas not visible to the eye (such as between the contact areas of the teeth) can be checked for cavities.    °TREATMENT  °If dental caries is in its early stages, it may be reversed with a fluoride treatment or an application of a remineralizing agent at the dental office. Thorough brushing and flossing at home is needed to aid these treatments. If it is in its later stages, treatment depends on the location and extent of tooth destruction:  °· If a small area of the tooth has been destroyed, the destroyed area will be removed and cavities will be filled with a material such as gold, silver amalgam, or composite resin.   °· If a large area of the tooth has been destroyed, the destroyed area will be removed and a cap (crown) will be fitted over the remaining tooth structure.   °· If the center part of the tooth (pulp) is affected, a procedure called a root canal will be needed before a filling or crown can be placed.   °· If most of the tooth has been destroyed, the tooth may need to be pulled (extracted). °HOME CARE INSTRUCTIONS °You can prevent, stop, or reverse dental caries at home by practicing good oral hygiene. Good oral hygiene includes: °· Thoroughly cleaning your teeth at least twice a day with a toothbrush and dental floss.   °· Using a fluoride toothpaste. A fluoride mouth rinse may also be used if recommended by your dentist or health care provider.   °· Restricting the amount of sugary and starchy foods and sugary liquids you consume.   °· Avoiding frequent snacking on these foods and sipping of these liquids.   °· Keeping regular visits with   a dentist for checkups and cleanings. PREVENTION   Practice good oral hygiene.  Consider a dental sealant. A dental sealant is a coating material that is applied by your dentist to the pits and grooves of teeth. The sealant prevents food from being trapped in them. It may protect the teeth for several years.  Ask about fluoride supplements if you live in a community without fluorinated water or with water that has a low fluoride content. Use fluoride supplements  as directed by your dentist or health care provider.  Allow fluoride varnish applications to teeth if directed by your dentist or health care provider.   This information is not intended to replace advice given to you by your health care provider. Make sure you discuss any questions you have with your health care provider.   Document Released: 12/19/2001 Document Revised: 04/19/2014 Document Reviewed: 03/31/2012 Elsevier Interactive Patient Education 2016 Elsevier Inc.  Dental Pain Dental pain may be caused by many things, including:  Tooth decay (cavities or caries). Cavities expose the nerve of your tooth to air and hot or cold temperatures. This can cause pain or discomfort.  Abscess or infection. A dental abscess is a collection of infected pus from a bacterial infection in the inner part of the tooth (pulp). It usually occurs at the end of the tooth's root.  Injury.  An unknown reason (idiopathic). Your pain may be mild or severe. It may only occur when:  You are chewing.  You are exposed to hot or cold temperature.  You are eating or drinking sugary foods or beverages, such as soda or candy. Your pain may also be constant. HOME CARE INSTRUCTIONS Watch your dental pain for any changes. The following actions may help to lessen any discomfort that you are feeling:  Take medicines only as directed by your dentist.  If you were prescribed an antibiotic medicine, finish all of it even if you start to feel better.  Keep all follow-up visits as directed by your dentist. This is important.  Do not apply heat to the outside of your face.  Rinse your mouth or gargle with salt water if directed by your dentist. This helps with pain and swelling.  You can make salt water by adding  tsp of salt to 1 cup of warm water.  Apply ice to the painful area of your face:  Put ice in a plastic bag.  Place a towel between your skin and the bag.  Leave the ice on for 20 minutes, 2-3  times per day.  Avoid foods or drinks that cause you pain, such as:  Very hot or very cold foods or drinks.  Sweet or sugary foods or drinks. SEEK MEDICAL CARE IF:  Your pain is not controlled with medicines.  Your symptoms are worse.  You have new symptoms. SEEK IMMEDIATE MEDICAL CARE IF:  You are unable to open your mouth.  You are having trouble breathing or swallowing.  You have a fever.  Your face, neck, or jaw is swollen.   This information is not intended to replace advice given to you by your health care provider. Make sure you discuss any questions you have with your health care provider.   Document Released: 03/29/2005 Document Revised: 08/13/2014 Document Reviewed: 03/25/2014 Elsevier Interactive Patient Education 2016 Elsevier Inc. Earache An earache, also called otalgia, can be caused by many things. Pain from an earache can be sharp, dull, or burning. The pain may be temporary or constant. Earaches can be caused  by problems with the ear, such as infection in either the middle ear or the ear canal, injury, impacted ear wax, middle ear pressure, or a foreign body in the ear. Ear pain can also result from problems in other areas. This is called referred pain. For example, pain can come from a sore throat, a tooth infection, or problems with the jaw or the joint between the jaw and the skull (temporomandibular joint, or TMJ). The cause of an earache is not always easy to identify. Watchful waiting may be appropriate for some earaches until a clear cause of the pain can be found. HOME CARE INSTRUCTIONS Watch your condition for any changes. The following actions may help to lessen any discomfort that you are feeling:  Take medicines only as directed by your health care provider. This includes ear drops.  Apply ice to your outer ear to help reduce pain.  Put ice in a plastic bag.  Place a towel between your skin and the bag.  Leave the ice on for 20 minutes, 2-3  times per day.  Do not put anything in your ear other than medicine that is prescribed by your health care provider.  Try resting in an upright position instead of lying down. This may help to reduce pressure in the middle ear and relieve pain.  Chew gum if it helps to relieve your ear pain.  Control any allergies that you have.  Keep all follow-up visits as directed by your health care provider. This is important. SEEK MEDICAL CARE IF:  Your pain does not improve within 2 days.  You have a fever.  You have new or worsening symptoms. SEEK IMMEDIATE MEDICAL CARE IF:  You have a severe headache.  You have a stiff neck.  You have difficulty swallowing.  You have redness or swelling behind your ear.  You have drainage from your ear.  You have hearing loss.  You feel dizzy.   This information is not intended to replace advice given to you by your health care provider. Make sure you discuss any questions you have with your health care provider.   Document Released: 11/14/2003 Document Revised: 04/19/2014 Document Reviewed: 10/28/2013 Elsevier Interactive Patient Education 2016 [employer]   [employer] 211 is a great source of information about community services available.  Access by dialing 2-1-1 from anywhere in [place], or by website -  PooledIncome.plwww.nc211.org.   Other Local Resources (Updated 04/2015)  Dental  Care   Services    Phone Number and Address  Cost  [facility] For children 730 - 25 years of age:   Cleaning  Tooth brushing/flossing instruction  Sealants, fillings, crowns  Extractions  Emergency treatment  [phone] [address] [place], [place] [id number] Charges based on family income.  Medicaid and some insurance plans accepted.     [facility]  Cleaning  Sealants, fillings, crowns  Extractions  Emergency  treatment [phone] [address] [place], [place]  Pregnant women 25 years of age or older with a Medicaid card  [facility] - [place]  Cleaning  Sealants, fillings, crowns  Extractions  Emergency treatment [phone] [address] [place], [place] Pregnant women 25 years of age or older with a Medicaid card  [facility] For children 850 - 921 years of age:   Cleaning  Tooth brushing/flossing instruction  Sealants, fillings, crowns  Extractions  Emergency treatment Limited orthodontic services for patients with Medicaid [phone] [address] [place], [place] [address] Medicaid and [facility] Health Choice cover for children up to age 35 and pregnant women.  Parents of children up to age 58 without Medicaid pay a reduced fee at time of service.  [facility] [employer] For children 64 - 29 years of age:   Cleaning  Tooth brushing/flossing instruction  Sealants, fillings, crowns  Extractions  Emergency treatment Limited orthodontic services for patients with Medicaid [phone] [address] [place], [place].  Medicaid and [place] Health Choice cover for children up to age 25 and pregnant women.  Parents of children up to age 46 without Medicaid pay a reduced fee.  [facility]  Sealants, fillings, crowns  Extractions  Hours: Tuesdays and Thursdays, 4:15 - 8 pm [phone] [address] [place], [place] [address] Services free of charge to [facility] residents ages 18-64 who do not have health insurance, Medicare, [place], or [place] benefits and fall within federal poverty guidelines  [employer]    Provides dental care in addition to primary medical care, nutritional counseling, and pharmacy:  Nurse, mental health, fillings,  crowns  Extractions                  [phone] [facility], [address] [place], [place]  [phone] [facility], [address] [place], [place]  [phone] [facility] [place], [place]  [phone] [facility], [address] [place], [place]  [phone] [facility] [address] [place], [place] Accepts [place], [place], most insurance.  Also provides services available to all with fees adjusted based on ability to pay.    [facility]  Cleaning  Tooth brushing/flossing instruction  Sealants, fillings, crowns  Extractions  Emergency treatment Hours: Tuesdays, Thursdays, and Fridays from 8 am to 5 pm by appointment only. [phone] [address] [place], [place] [address] [facility] residents with Medicaid (depending on eligibility) and children with [facility] Health Choice - call for more information.  [facility]  Extractions only  Hours: 2nd and 4th Thursday of each month from 6:30 am - 9 am.   [phone] ext. [phone] [place], [place] [address] Ages 30 and older only.  Patients are seen on a first come, first served basis.  [employer]  [employer]  Extractions  Orthodontics  Endodontics  Implants/Crowns/Bridges  Complete and partial dentures [phone] [place], [place] Patients must complete an application for services.  There is often a waiting list.

## 2015-06-28 NOTE — ED Provider Notes (Signed)
CSN: [id number]     Arrival date & time 06/28/15  1146 History  By signing my name below, I, Sonum Patel, attest that this documentation has been prepared under the direction and in the presence of Jyren Cerasoli PA-C. Electronically Signed: Sonum Patel, Scribe. 06/28/2015. 1:16 PM.    Chief Complaint  Patient presents with  . Otalgia   The history is provided by the patient. No language interpreter was used.     HPI Comments: Chris Krause is a 25 y.o. male who presents to the Emergency Department complaining of several days of constant, worsened left-sided otalgia. He also complains of ongoing, constant left lower dental pain from a fractured molar and right ear muffling. He has not been evaluated for the otalgia or the dental pain but has used an OTC ear canal irrigation without significant relief. He denies ear bleeding, ear drainage, oral swelling, fever, chills, sore throat, difficulty swallowing, cough, congestion, rhinorrhea. He denies allergies or a history of frequent ear infections.    History reviewed. No pertinent past medical history. History reviewed. No pertinent past surgical history. History reviewed. No pertinent family history. Social History  Substance Use Topics  . Smoking status: Former Smoker    Types: Cigarettes  . Smokeless tobacco: None  . Alcohol Use: Yes     Comment: ocasionally    Review of Systems  Constitutional: Negative for fever and chills.  HENT: Positive for dental problem, ear pain (left) and hearing loss (muffling to right). Negative for congestion, ear discharge, postnasal drip, rhinorrhea, sore throat and trouble swallowing.   Respiratory: Negative for cough.   All other systems reviewed and are negative.   Allergies  Review of patient's allergies indicates no known allergies.  Home Medications   Prior to Admission medications   Medication Sig Start Date End Date Taking? Authorizing Provider  ibuprofen (ADVIL,MOTRIN) 400 MG tablet Take  1 tablet (400 mg total) by mouth every 6 (six) hours as needed. 06/28/15   Danelle Berry, PA-C  levETIRAcetam (KEPPRA) 500 MG tablet Take 1 tablet (500 mg total) by mouth 2 (two) times daily. 11/04/13   Blane Ohara, MD  levETIRAcetam (KEPPRA) 500 MG tablet Take 1 tablet (500 mg total) by mouth 2 (two) times daily. 02/03/14   Devoria Albe, MD  ondansetron (ZOFRAN ODT) 4 MG disintegrating tablet Take 1 tablet (4 mg total) by mouth every 8 (eight) hours as needed for nausea or vomiting. 06/07/15   Shon Baton, MD  oseltamivir (TAMIFLU) 75 MG capsule Take 1 capsule (75 mg total) by mouth every 12 (twelve) hours. 06/07/15   Shon Baton, MD  traMADol (ULTRAM) 50 MG tablet Take 1 tablet (50 mg total) by mouth every 6 (six) hours as needed. 06/28/15   Danelle Berry, PA-C   BP 109/68 mmHg  Pulse 74  Temp(Src) 99 F (37.2 C) (Oral)  Resp 16  SpO2 100% Physical Exam  Constitutional: He is oriented to person, place, and time. He appears well-developed and well-nourished.  HENT:  Head: Normocephalic and atraumatic.  Right Ear: Tympanic membrane and ear canal normal.  Left Ear: No drainage or swelling.  Mouth/Throat: No trismus in the jaw.  Most posterior lower left molar has a large cary and vast dental decay surrounding the affected tooth. No tenderness to the alveolar buccal gums. No sublingual tenderness, no trismus.   Left ear: pre and post auricular tenderness. Tragus tenderness. Auditory canal normal without edema, erythema, or drainage  Right ear: normal TM and canal. No  tenderness   Cardiovascular: Normal rate.   Pulmonary/Chest: Effort normal.  Abdominal: He exhibits no distension.  Neurological: He is alert and oriented to person, place, and time.  Skin: Skin is warm and dry.  Psychiatric: He has a normal mood and affect.  Nursing note and vitals reviewed.   ED Course  Procedures (including critical care time)  DIAGNOSTIC STUDIES: Oxygen Saturation is 100% on RA, normal by my  interpretation.    COORDINATION OF CARE: 1:28 PM Will treat with penicillin and give tramadol and ibuprofen for pain control. Discussed treatment plan with pt at bedside and pt agreed to plan.   Labs Review Labs Reviewed - No data to display  Imaging Review No results found.    EKG Interpretation None      MDM   Pt with otalgia, found to have referred dental pain secondary to caries.  No Concern for Ludwig's angina or spreading infection.  No drainable abscess palpated. Will treat with penicillin, pain medication and NSAIDs as needed for discomfort. Encourage dental follow-up. Resource guide given.  Return precautions reviewed. Patient was discharged in good condition with stable vital signs.  Final diagnoses:  Otalgia of left ear  Pain due to dental caries   I personally performed the services described in this documentation, which was scribed in my presence. The recorded information has been reviewed and is accurate.     Danelle BerryLeisa Dezaray Shibuya, PA-C 07/07/15 0159  Tomasita CrumbleAdeleke Oni, MD 07/07/15 [phone]

## 2017-07-26 ENCOUNTER — Emergency Department (HOSPITAL_COMMUNITY): Payer: Self-pay

## 2017-07-26 ENCOUNTER — Encounter (HOSPITAL_COMMUNITY): Payer: Self-pay

## 2017-07-26 ENCOUNTER — Emergency Department (HOSPITAL_COMMUNITY)
Admission: EM | Admit: 2017-07-26 | Discharge: 2017-07-27 | Disposition: A | Discharge status: Home / Self Care | Payer: Self-pay | Attending: Emergency Medicine | Admitting: Emergency Medicine

## 2017-07-26 DIAGNOSIS — Y929 Unspecified place or not applicable: Secondary | ICD-10-CM | POA: Insufficient documentation

## 2017-07-26 DIAGNOSIS — W268XXA Contact with other sharp object(s), not elsewhere classified, initial encounter: Secondary | ICD-10-CM | POA: Insufficient documentation

## 2017-07-26 DIAGNOSIS — S81812A Laceration without foreign body, left lower leg, initial encounter: Secondary | ICD-10-CM | POA: Insufficient documentation

## 2017-07-26 DIAGNOSIS — Y999 Unspecified external cause status: Secondary | ICD-10-CM | POA: Insufficient documentation

## 2017-07-26 DIAGNOSIS — Z87891 Personal history of nicotine dependence: Secondary | ICD-10-CM | POA: Insufficient documentation

## 2017-07-26 DIAGNOSIS — Z79899 Other long term (current) drug therapy: Secondary | ICD-10-CM | POA: Insufficient documentation

## 2017-07-26 DIAGNOSIS — Y9389 Activity, other specified: Secondary | ICD-10-CM | POA: Insufficient documentation

## 2017-07-26 DIAGNOSIS — Z23 Encounter for immunization: Secondary | ICD-10-CM | POA: Insufficient documentation

## 2017-07-26 DIAGNOSIS — S91012A Laceration without foreign body, left ankle, initial encounter: Secondary | ICD-10-CM

## 2017-07-26 MED ORDER — TETANUS-DIPHTH-ACELL PERTUSSIS 5-2.5-18.5 LF-MCG/0.5 IM SUSP
0.5000 mL | Freq: Once | INTRAMUSCULAR | Status: AC
  Administered 2017-07-27: 0.5 mL via INTRAMUSCULAR
  Filled 2017-07-26: qty 0.5

## 2017-07-26 MED ORDER — BACITRACIN ZINC 500 UNIT/GM EX OINT: 1.0000 "application " | TOPICAL_OINTMENT | Freq: Two times a day (BID) | CUTANEOUS | 0 refills | Status: AC

## 2017-07-26 MED ORDER — HYDROCODONE-ACETAMINOPHEN 5-325 MG PO TABS
1.0000 | ORAL_TABLET | Freq: Once | ORAL | Status: AC
Start: 2017-07-26 — End: 2017-07-27
  Administered 2017-07-27: 1 via ORAL
  Filled 2017-07-26: qty 1

## 2017-07-26 NOTE — ED Triage Notes (Signed)
Pt states a refridgerator fell on his leg two nights ago, he has about a 2 inch laceration above the left ankle Bleeding controlled

## 2017-07-26 NOTE — ED Provider Notes (Signed)
[facility]-EMERGENCY DEPT Provider Note   CSN: [id number] Arrival date & time: 07/26/17  2104     History   Chief Complaint Chief Complaint  Patient presents with  . Extremity Laceration    HPI Chris Krause is a 27 y.o. male.   27 year old male presents to the emergency department for evaluation of laceration to his left ankle.  Injury occurred 48 hours prior while moving a refrigerator.  He states that he has had associated pain to the area which has been constant and worse with weightbearing.  It is unrelieved with 800 mg ibuprofen.  He states that he tried coming to the emergency department yesterday for closure of his laceration, but the wait was too long and he left.  He cannot recall the date of his last tetanus shot.  The history is provided by the patient. No language interpreter was used.    History reviewed. No pertinent past medical history.  There are no active problems to display for this patient.   History reviewed. No pertinent surgical history.      Home Medications    Prior to Admission medications   Medication Sig Start Date End Date Taking? Authorizing Provider  bacitracin ointment Apply 1 application topically 2 (two) times daily. 07/26/17   Antony MaduraHumes, Joaquina Nissen, PA-C  ibuprofen (ADVIL,MOTRIN) 400 MG tablet Take 1 tablet (400 mg total) by mouth every 6 (six) hours as needed. 06/28/15   Danelle Berryapia, Leisa, PA-C  levETIRAcetam (KEPPRA) 500 MG tablet Take 1 tablet (500 mg total) by mouth 2 (two) times daily. 11/04/13   Blane OharaZavitz, Joshua, MD  levETIRAcetam (KEPPRA) 500 MG tablet Take 1 tablet (500 mg total) by mouth 2 (two) times daily. 02/03/14   Devoria AlbeKnapp, Iva, MD  ondansetron (ZOFRAN ODT) 4 MG disintegrating tablet Take 1 tablet (4 mg total) by mouth every 8 (eight) hours as needed for nausea or vomiting. 06/07/15   Horton, Mayer Maskerourtney F, MD  oseltamivir (TAMIFLU) 75 MG capsule Take 1 capsule (75 mg total) by mouth every 12 (twelve) hours. 06/07/15   Horton,  Mayer Maskerourtney F, MD  traMADol (ULTRAM) 50 MG tablet Take 1 tablet (50 mg total) by mouth every 6 (six) hours as needed. 06/28/15   Danelle Berryapia, Leisa, PA-C    Family History History reviewed. No pertinent family history.  Social History Social History   Tobacco Use  . Smoking status: Former Smoker    Types: Cigarettes  . Smokeless tobacco: Never Used  Substance Use Topics  . Alcohol use: Yes    Comment: ocasionally  . Drug use: Yes    Types: Marijuana     Allergies   Patient has no known allergies.   Review of Systems Review of Systems Ten systems reviewed and are negative for acute change, except as noted in the HPI.    Physical Exam Updated Vital Signs BP 106/80 (BP Location: Right Arm)   Pulse 83   Temp 97.8 F (36.6 C) (Oral)   Resp 18   Ht 5\' 11"  (1.803 m)   Wt 65.8 kg (145 lb)   SpO2 98%   BMI 20.22 kg/m   Physical Exam  Constitutional: He is oriented to person, place, and time. He appears well-developed and well-nourished. No distress.  Nontoxic appearing and in NAD  HENT:  Head: Normocephalic and atraumatic.  Eyes: Conjunctivae and EOM are normal. No scleral icterus.  Neck: Normal range of motion.  Cardiovascular: Normal rate, regular rhythm and intact distal pulses.  DP pulse 2+ in the LLE  Pulmonary/Chest: Effort normal. No respiratory distress.  Respirations even and unlabored  Musculoskeletal: Normal range of motion.       Left ankle: He exhibits laceration.       Feet:  4cm laceration superior to the lateral malleolus of the L ankle. TTP associated with laceration site. No bony deformity or crepitus.  Neurological: He is alert and oriented to person, place, and time. He exhibits normal muscle tone. Coordination normal.  Skin: Skin is warm and dry. No rash noted. He is not diaphoretic. No erythema. No pallor.  Psychiatric: He has a normal mood and affect. His behavior is normal.  Nursing note and vitals reviewed.    ED Treatments / Results   Labs (all labs ordered are listed, but only abnormal results are displayed) Labs Reviewed - No data to display  EKG None  Radiology Dg Ankle Complete Left  Result Date: 07/26/2017 CLINICAL DATA:  Injury with laceration and pain EXAM: LEFT ANKLE COMPLETE - 3+ VIEW COMPARISON:  None. FINDINGS: There is no evidence of fracture, dislocation, or joint effusion. Lateral soft tissue swelling. Ankle mortise is normal IMPRESSION: No acute osseous abnormality Electronically Signed   By: Jasmine Pang M.D.   On: 07/26/2017 23:33    Procedures Procedures (including critical care time)  Medications Ordered in ED Medications  Tdap (BOOSTRIX) injection 0.5 mL (has no administration in time range)  HYDROcodone-acetaminophen (NORCO/VICODIN) 5-325 MG per tablet 1 tablet (has no administration in time range)     Initial Impression / Assessment and Plan / ED Course  I have reviewed the triage vital signs and the nursing notes.  Pertinent labs & imaging results that were available during my care of the patient were reviewed by me and considered in my medical decision making (see chart for details).     Tdap booster given. Patient neurovascularly intact. Unable to repair laceration 2/2 onset 48 hours ago. Wound dressed appropriately. No concern for secondary infection or cellulitis at this time. Discussed wound care with patient and answered questions. Pt to follow up for wound check PRN. Return precautions discussed and provided. Patient discharged in stable condition with no unaddressed concerns.   Final Clinical Impressions(s) / ED Diagnoses   Final diagnoses:  Laceration of left ankle, initial encounter    ED Discharge Orders        Ordered    bacitracin ointment  2 times daily     07/26/17 2338       Antony Madura, PA-C 07/27/17 0005    Molpus, Jonny Ruiz, MD 07/27/17 [phone]

## 2017-07-26 NOTE — ED Notes (Signed)
Bed: [facility]2 Expected date:  Expected time:  Means of arrival:  Comments: 

## 2017-07-26 NOTE — Discharge Instructions (Signed)
Keep your wound covered for the next 5-7 days.  Apply bacitracin as prescribed to prevent infection.  We recommend wound rechecked by a primary care doctor in the next few days.  You may return for new or concerning symptoms.

## 2019-03-05 ENCOUNTER — Encounter (HOSPITAL_COMMUNITY): Payer: Self-pay | Admitting: Emergency Medicine

## 2019-03-05 ENCOUNTER — Other Ambulatory Visit: Payer: Self-pay

## 2019-03-05 ENCOUNTER — Emergency Department (HOSPITAL_COMMUNITY)
Admission: EM | Admit: 2019-03-05 | Discharge: 2019-03-05 | Disposition: A | Discharge status: Home / Self Care | Payer: Self-pay | Attending: Emergency Medicine | Admitting: Emergency Medicine

## 2019-03-05 DIAGNOSIS — Z79899 Other long term (current) drug therapy: Secondary | ICD-10-CM | POA: Insufficient documentation

## 2019-03-05 DIAGNOSIS — H9201 Otalgia, right ear: Secondary | ICD-10-CM | POA: Insufficient documentation

## 2019-03-05 DIAGNOSIS — K0889 Other specified disorders of teeth and supporting structures: Secondary | ICD-10-CM | POA: Insufficient documentation

## 2019-03-05 DIAGNOSIS — Z87891 Personal history of nicotine dependence: Secondary | ICD-10-CM | POA: Insufficient documentation

## 2019-03-05 MED ORDER — NEOMYCIN-POLYMYXIN-HC 3.5-10000-1 OT SUSP: 4.0000 [drp] | Freq: Four times a day (QID) | OTIC | 0 refills | Status: AC

## 2019-03-05 MED ORDER — LIDOCAINE VISCOUS HCL 2 % MT SOLN: 15.0000 mL | OROMUCOSAL | 2 refills | Status: AC | PRN

## 2019-03-05 MED ORDER — PENICILLIN V POTASSIUM 500 MG PO TABS: 500.0000 mg | ORAL_TABLET | Freq: Four times a day (QID) | ORAL | 0 refills | Status: AC

## 2019-03-05 MED ORDER — IBUPROFEN 400 MG PO TABS
600.0000 mg | ORAL_TABLET | Freq: Once | ORAL | Status: AC
  Administered 2019-03-05: 600 mg via ORAL
  Filled 2019-03-05: qty 1

## 2019-03-05 NOTE — Discharge Instructions (Signed)
Administer 4 drops of the eardrops into the right ear 4 times daily for 7 days.   Dental Pain You have been seen today for dental pain. You should follow up with a dentist as soon as possible. This problem will not resolve on its own without the care of a dentist.  Lidocaine liquid: Use the viscous lidocaine for mouth pain. Swish with the lidocaine and spit it out. Do not swallow it. Salt water solution: You should also swish with a homemade salt water solution, twice a day.  Make this solution by mixing 8 ounces of warm water with about half a teaspoon of salt. Antiinflammatory medications: Take 600 mg of ibuprofen every 6 hours or 440 mg (over the counter dose) to 500 mg (prescription dose) of naproxen every 12 hours for the next 3 days. After this time, these medications may be used as needed for pain. Take these medications with food to avoid upset stomach. Choose only one of these medications, do not take them together. Acetaminophen (generic for Tylenol): Should you continue to have additional pain while taking the ibuprofen or naproxen, you may add in acetaminophen as needed. Your daily total maximum amount of acetaminophen from all sources should be limited to 4000mg /day for persons without liver problems, or 2000mg /day for those with liver problems.  Please take all of your antibiotics until finished!   You may develop abdominal discomfort or diarrhea from the antibiotic.  You may help offset this with probiotics which you can buy or get in yogurt. Do not eat or take the probiotics until 2 hours after your antibiotic.   For prescription assistance, may try using prescription discount sites or apps, such as goodrx.[place], [address] [place], [place] [address] [phone]  [place], [place] [address] [phone]  [place] [address] [place], [place] [address] [phone] ext. [place] [address] [place] [address] [place], [place] [address] [phone]  [facility] [address] [place], [place] [address] [phone]  [facility] [employer] [email or web address]  [place] [address] [place], [place] [address] [phone]  Website for free, low-income, or sliding scale dental services in [place]: www.freedentalcare.[place]  To find a dentist in [place] and surrounding areas: CardCollectible.com.ee  [facility] [email or web address]  [facility] Medicaid Dentist [email or web address]

## 2019-03-05 NOTE — ED Notes (Signed)
Patient verbalizes understanding of discharge instructions. Opportunity for questioning and answers were provided. Armband removed by staff, pt discharged from ED.  

## 2019-03-05 NOTE — ED Triage Notes (Signed)
Pt c/o dental pain x 2 weeks.  

## 2019-03-05 NOTE — ED Provider Notes (Signed)
[facility]   CSN: [id number] Arrival date & time: 03/05/19  1844     History   Chief Complaint Chief Complaint  Patient presents with  . Dental Pain    HPI Claudean Kindsevin C Barbian is a 28 y.o. male.     HPI   Claudean Kindsevin C Glanzer is a 28 y.o. male, patient with no pertinent past medical history, presenting to the ED with right lower tooth pain for at least the last 10 days.  Pain is aching, moderate to severe, radiating through the lower jaw. Also complains of right ear pain for the past 2 weeks.  Pain is throbbing, moderate to severe, radiating toward the jaw. Denies fever/chills, nausea/vomiting, dizziness, ear drainage, throat pain, mouth swelling, or any other complaints.   History reviewed. No pertinent past medical history.  There are no active problems to display for this patient.   History reviewed. No pertinent surgical history.      Home Medications    Prior to Admission medications   Medication Sig Start Date End Date Taking? Authorizing Provider  bacitracin ointment Apply 1 application topically 2 (two) times daily. 07/26/17   Antony MaduraHumes, Kelly, PA-C  ibuprofen (ADVIL,MOTRIN) 400 MG tablet Take 1 tablet (400 mg total) by mouth every 6 (six) hours as needed. 06/28/15   Danelle Berryapia, Leisa, PA-C  levETIRAcetam (KEPPRA) 500 MG tablet Take 1 tablet (500 mg total) by mouth 2 (two) times daily. 11/04/13   Blane OharaZavitz, Joshua, MD  levETIRAcetam (KEPPRA) 500 MG tablet Take 1 tablet (500 mg total) by mouth 2 (two) times daily. 02/03/14   Devoria AlbeKnapp, Iva, MD  lidocaine (XYLOCAINE) 2 % solution Use as directed 15 mLs in the mouth or throat as needed for mouth pain. 03/05/19   Chirstine Defrain C, PA-C  neomycin-polymyxin-hydrocortisone (CORTISPORIN) 3.5-10000-1 OTIC suspension Place 4 drops into the right ear 4 (four) times daily for 7 days. 03/05/19 03/12/19  Gedeon Brandow C, PA-C  ondansetron (ZOFRAN ODT) 4 MG disintegrating tablet Take 1 tablet (4 mg total)  by mouth every 8 (eight) hours as needed for nausea or vomiting. 06/07/15   Horton, Mayer Maskerourtney F, MD  oseltamivir (TAMIFLU) 75 MG capsule Take 1 capsule (75 mg total) by mouth every 12 (twelve) hours. 06/07/15   Horton, Mayer Maskerourtney F, MD  penicillin v potassium (VEETID) 500 MG tablet Take 1 tablet (500 mg total) by mouth 4 (four) times daily for 7 days. 03/05/19 03/12/19  Sarahjane Matherly C, PA-C  traMADol (ULTRAM) 50 MG tablet Take 1 tablet (50 mg total) by mouth every 6 (six) hours as needed. 06/28/15   Danelle Berryapia, Leisa, PA-C    Family History No family history on file.  Social History Social History   Tobacco Use  . Smoking status: Former Smoker    Types: Cigarettes  . Smokeless tobacco: Never Used  Substance Use Topics  . Alcohol use: Yes    Comment: ocasionally  . Drug use: Yes    Types: Marijuana     Allergies   Patient has no known allergies.   Review of Systems Review of Systems  Constitutional: Negative for fever.  HENT: Positive for dental problem and ear pain. Negative for drooling, ear discharge, sore throat, trouble swallowing and voice change.   Respiratory: Negative for shortness of breath.   Gastrointestinal: Negative for nausea and vomiting.  Musculoskeletal: Negative for neck pain.  Neurological: Negative for dizziness.  All other systems reviewed and are negative.    Physical Exam Updated Vital Signs BP  122/82 (BP Location: Left Arm)   Pulse 76   Temp 99 F (37.2 C) (Oral)   Resp 14   SpO2 99%   Physical Exam Vitals signs and nursing note reviewed.  Constitutional:      General: He is not in acute distress.    Appearance: He is well-developed. He is not diaphoretic.  HENT:     Head: Normocephalic and atraumatic.     Right Ear: Tympanic membrane and external ear normal.     Left Ear: Tympanic membrane, ear canal and external ear normal.     Ears:     Comments: Tenderness over the right tragus without swelling or color change.  No mastoid tenderness.  No  pain, tenderness, or swelling to the auricle. The right ear canal is erythematous and inflamed.  It is tender with even minimal insertion of the otoscope.  No noted lesions or exudate.    Mouth/Throat:     Mouth: Mucous membranes are moist.     Pharynx: Oropharynx is clear.     Comments: Erosion down to the gumline noted to the right mandibular rearmost molar without noted pulp exposure.  Dentition otherwise appears to be intact and stable.  No noted area of swelling or fluctuance.  No trismus or noted abnormal phonation.  Mouth opening to at least 3 finger widths.  Handles oral secretions without difficulty.  No noted facial swelling.  No sublingual swelling.  No swelling or tenderness to the submental or submandibular regions.  No swelling or tenderness into the soft tissues of the neck. Eyes:     Conjunctiva/sclera: Conjunctivae normal.  Neck:     Musculoskeletal: Neck supple.  Cardiovascular:     Rate and Rhythm: Normal rate and regular rhythm.     Pulses: Normal pulses.  Pulmonary:     Effort: Pulmonary effort is normal. No respiratory distress.  Abdominal:     Tenderness: There is no guarding.  Lymphadenopathy:     Cervical: No cervical adenopathy.  Skin:    General: Skin is warm and dry.  Neurological:     Mental Status: He is alert.  Psychiatric:        Mood and Affect: Mood and affect normal.        Speech: Speech normal.        Behavior: Behavior normal.      ED Treatments / Results  Labs (all labs ordered are listed, but only abnormal results are displayed) Labs Reviewed - No data to display  EKG None  Radiology No results found.  Procedures Procedures (including critical care time)  Medications Ordered in ED Medications  ibuprofen (ADVIL) tablet 600 mg (600 mg Oral Given 03/05/19 2154)     Initial Impression / Assessment and Plan / ED Course  I have reviewed the triage vital signs and the nursing notes.  Pertinent labs & imaging results that were  available during my care of the patient were reviewed by me and considered in my medical decision making (see chart for details).        Patient presents with 2 complaints that could have been related by their description, however, I do think they are 2 separate etiologies of pain. Patient is nontoxic appearing, afebrile, not tachycardic, not tachypneic, not hypotensive, maintains excellent SPO2 on room air, and is in no apparent distress.  For the ear, I suspect otitis externa.  We will treat with otic drops. Patient does have a dental reason for pain noted on exam as well.  Dental referral given. The patient was given instructions for home care as well as return precautions. Patient voices understanding of these instructions, accepts the plan, and is comfortable with discharge.  Vitals:   03/05/19 1850 03/05/19 2150 03/05/19 2236  BP: 122/82 126/90 110/61  Pulse: 76 83 69  Resp: 14 15 18   Temp: 99 F (37.2 C)    TempSrc: Oral    SpO2: 99% 98% 99%     Final Clinical Impressions(s) / ED Diagnoses   Final diagnoses:  Pain, dental  Right ear pain    ED Discharge Orders         Ordered    neomycin-polymyxin-hydrocortisone (CORTISPORIN) 3.5-10000-1 OTIC suspension  4 times daily     03/05/19 2202    penicillin v potassium (VEETID) 500 MG tablet  4 times daily     03/05/19 2202    lidocaine (XYLOCAINE) 2 % solution  As needed     03/05/19 2203           2204 03/06/19 2012    2013, MD 03/07/19 1550

## 2019-03-24 ENCOUNTER — Other Ambulatory Visit: Payer: Self-pay

## 2019-03-24 ENCOUNTER — Emergency Department (HOSPITAL_COMMUNITY): Payer: Self-pay

## 2019-03-24 ENCOUNTER — Emergency Department (HOSPITAL_COMMUNITY)
Admission: EM | Admit: 2019-03-24 | Discharge: 2019-03-24 | Disposition: A | Discharge status: Home / Self Care | Payer: Self-pay | Attending: Emergency Medicine | Admitting: Emergency Medicine

## 2019-03-24 ENCOUNTER — Encounter (HOSPITAL_COMMUNITY): Payer: Self-pay | Admitting: *Deleted

## 2019-03-24 DIAGNOSIS — Z91199 Patient's noncompliance with other medical treatment and regimen due to unspecified reason: Secondary | ICD-10-CM

## 2019-03-24 DIAGNOSIS — Z9119 Patient's noncompliance with other medical treatment and regimen: Secondary | ICD-10-CM | POA: Insufficient documentation

## 2019-03-24 DIAGNOSIS — Z87891 Personal history of nicotine dependence: Secondary | ICD-10-CM | POA: Insufficient documentation

## 2019-03-24 DIAGNOSIS — E162 Hypoglycemia, unspecified: Secondary | ICD-10-CM | POA: Insufficient documentation

## 2019-03-24 DIAGNOSIS — R569 Unspecified convulsions: Secondary | ICD-10-CM | POA: Insufficient documentation

## 2019-03-24 HISTORY — DX: Unspecified convulsions: R56.9

## 2019-03-24 LAB — CBC WITH DIFFERENTIAL/PLATELET
Abs Immature Granulocytes: 0.05 10*3/uL (ref 0.00–0.07)
Basophils Absolute: 0 10*3/uL (ref 0.0–0.1)
Basophils Relative: 1 %
Eosinophils Absolute: 0.1 10*3/uL (ref 0.0–0.5)
Eosinophils Relative: 2 %
HCT: 38.4 % — ABNORMAL LOW (ref 39.0–52.0)
Hemoglobin: 12.4 g/dL — ABNORMAL LOW (ref 13.0–17.0)
Immature Granulocytes: 1 %
Lymphocytes Relative: 13 %
Lymphs Abs: 1 10*3/uL (ref 0.7–4.0)
MCH: 29 pg (ref 26.0–34.0)
MCHC: 32.3 g/dL (ref 30.0–36.0)
MCV: 89.9 fL (ref 80.0–100.0)
Monocytes Absolute: 0.4 10*3/uL (ref 0.1–1.0)
Monocytes Relative: 5 %
Neutro Abs: 6.3 10*3/uL (ref 1.7–7.7)
Neutrophils Relative %: 78 %
Platelets: 202 10*3/uL (ref 150–400)
RBC: 4.27 MIL/uL (ref 4.22–5.81)
RDW: 13.1 % (ref 11.5–15.5)
WBC: 7.9 10*3/uL (ref 4.0–10.5)
nRBC: 0 % (ref 0.0–0.2)

## 2019-03-24 LAB — BASIC METABOLIC PANEL
Anion gap: 10 (ref 5–15)
BUN: 9 mg/dL (ref 6–20)
CO2: 25 mmol/L (ref 22–32)
Calcium: 9.1 mg/dL (ref 8.9–10.3)
Chloride: 103 mmol/L (ref 98–111)
Creatinine, Ser: 0.87 mg/dL (ref 0.61–1.24)
GFR calc Af Amer: >60 mL/min (ref >60)
GFR calc non Af Amer: >60 mL/min (ref >60)
Glucose, Bld: 63 mg/dL — ABNORMAL LOW (ref 70–99)
Potassium: 3.9 mmol/L (ref 3.5–5.1)
Sodium: 138 mmol/L (ref 135–145)

## 2019-03-24 LAB — RAPID URINE DRUG SCREEN, HOSP PERFORMED
Amphetamines: NOT DETECTED
Barbiturates: NOT DETECTED
Benzodiazepines: NOT DETECTED
Cocaine: NOT DETECTED
Opiates: NOT DETECTED
Tetrahydrocannabinol: POSITIVE — AB

## 2019-03-24 LAB — CBG MONITORING, ED
Glucose-Capillary: 46 mg/dL — ABNORMAL LOW (ref 70–99)
Glucose-Capillary: 50 mg/dL — ABNORMAL LOW (ref 70–99)
Glucose-Capillary: 114 mg/dL — ABNORMAL HIGH (ref 70–99)
Glucose-Capillary: 123 mg/dL — ABNORMAL HIGH (ref 70–99)
Glucose-Capillary: 83 mg/dL (ref 70–99)

## 2019-03-24 MED ORDER — SODIUM CHLORIDE 0.9 % IV BOLUS
1000.0000 mL | Freq: Once | INTRAVENOUS | Status: AC
  Administered 2019-03-24: 1000 mL via INTRAVENOUS

## 2019-03-24 MED ORDER — THIAMINE HCL 100 MG/ML IJ SOLN
100.0000 mg | Freq: Once | INTRAMUSCULAR | Status: AC
  Administered 2019-03-24: 100 mg via INTRAVENOUS
  Filled 2019-03-24: qty 2

## 2019-03-24 MED ORDER — ACETAMINOPHEN 325 MG PO TABS
650.0000 mg | ORAL_TABLET | Freq: Once | ORAL | Status: AC
  Administered 2019-03-24: 650 mg via ORAL
  Filled 2019-03-24: qty 2

## 2019-03-24 MED ORDER — DEXTROSE 10 % IV SOLN
INTRAVENOUS | Status: DC
  Administered 2019-03-24: 18:00:00 via INTRAVENOUS

## 2019-03-24 MED ORDER — LEVETIRACETAM 500 MG PO TABS: 500.0000 mg | ORAL_TABLET | Freq: Two times a day (BID) | ORAL | 1 refills | Status: AC

## 2019-03-24 MED ORDER — DEXTROSE 50 % IV SOLN
25.0000 mL | Freq: Once | INTRAVENOUS | Status: AC
  Administered 2019-03-24: 17:00:00 25 mL via INTRAVENOUS
  Filled 2019-03-24: qty 50

## 2019-03-24 MED ORDER — LEVETIRACETAM IN NACL 1000 MG/100ML IV SOLN
1000.0000 mg | Freq: Once | INTRAVENOUS | Status: AC
Start: 2019-03-24 — End: 2019-03-24
  Administered 2019-03-24: 1000 mg via INTRAVENOUS
  Filled 2019-03-24: qty 100

## 2019-03-24 MED ORDER — DEXTROSE 50 % IV SOLN
25.0000 mL | Freq: Once | INTRAVENOUS | Status: AC
  Administered 2019-03-24: 25 mL via INTRAVENOUS
  Filled 2019-03-24: qty 50

## 2019-03-24 NOTE — Discharge Instructions (Signed)
As we discussed it is important that you eat a meal tonight with high protein and complex carbs such as meat, bread, cheese, peanut butter etc.  You need to eat this before you go to bed tonight.    YOU MAY NOT DRIVE FOR 6 MONTHS!  As we discussed it is important that you take your medication.  Do not perform any activity that would cause increased danger to you or anyone else if you have a seizure.  This includes driving, operating heavy machinery.  Please do not work at heights.  Please do not swim as if you have a seizure you can drown.  It only takes a few inches of water to drown therefore you should not shower or bathe with out someone aware and listening for you.    I see that you have previously been given tramadol/ultram as a prescription (in 2017).  This medication can make you more likely to have seizures and you should not take it.

## 2019-03-24 NOTE — ED Notes (Signed)
Pt ate half of a [place] sandwich and said he can't eat anything else. Gave him a sprite

## 2019-03-24 NOTE — ED Notes (Signed)
Patient being taken to CT at this time.

## 2019-03-24 NOTE — ED Notes (Signed)
Pt offered food, but he declined to eat.

## 2019-03-24 NOTE — ED Notes (Signed)
Gave pt sprite, pnut butter, and sandwich. Encouraged him to eat. He declined saying that he couldn't eat at this time.

## 2019-03-24 NOTE — ED Provider Notes (Addendum)
[facility]-EMERGENCY DEPT Provider Note   CSN: [id number] Arrival date & time: 03/24/19  1541     History Chief Complaint  Patient presents with  . Seizures    Chris Krause is a 28 y.o. male with a past medical history of seizures who presents today after a witnessed seizure.  According to triage notes patient had a seizure lasting about 5 minutes and was postictal on EMS arrival.  He reports he has not taken his keppra in many years. Currently he states his only complaint is headache. He states that this is normal for him after seizure.  Chart review shows that he was recently seen for ear and dental pain. He states that he did not start those antibiotics due to financial reasons. He denies any fevers.  Patients mother was contacted.  She reports he did not fall and was in a car when he started having his seizure.  No concern for head trauma.    Patient reports that he has not had issues with hypoglycemia before.  Has not taken any insulin or diabetes oral medications.  He denies any recent illness or fevers.  No neck pain.     HPI     Past Medical History:  Diagnosis Date  . Seizures (HCC)     There are no problems to display for this patient.   History reviewed. No pertinent surgical history.     History reviewed. No pertinent family history.  Social History   Tobacco Use  . Smoking status: Former Smoker    Types: Cigarettes  . Smokeless tobacco: Never Used  Substance Use Topics  . Alcohol use: Yes    Comment: ocasionally  . Drug use: Yes    Types: Marijuana    Home Medications Prior to Admission medications   Medication Sig Start Date End Date Taking? Authorizing Provider  acetaminophen (TYLENOL) 325 MG tablet Take 650 mg by mouth every 6 (six) hours as needed for mild pain or moderate pain.   Yes [provider]  ibuprofen (ADVIL,MOTRIN) 400 MG tablet Take 1 tablet (400 mg total) by mouth every 6 (six) hours as  needed. Patient taking differently: Take 400 mg by mouth every 6 (six) hours as needed for mild pain or moderate pain.  06/28/15  Yes Danelle Berry, PA-C  bacitracin ointment Apply 1 application topically 2 (two) times daily. Patient not taking: Reported on 03/24/2019 07/26/17   Antony Madura, PA-C  levETIRAcetam (KEPPRA) 500 MG tablet Take 1 tablet (500 mg total) by mouth 2 (two) times daily. 03/24/19   Cristina Gong, PA-C  lidocaine (XYLOCAINE) 2 % solution Use as directed 15 mLs in the mouth or throat as needed for mouth pain. Patient not taking: Reported on 03/24/2019 03/05/19   Joy, Shawn C, PA-C  ondansetron (ZOFRAN ODT) 4 MG disintegrating tablet Take 1 tablet (4 mg total) by mouth every 8 (eight) hours as needed for nausea or vomiting. Patient not taking: Reported on 03/24/2019 06/07/15   Horton, Mayer Masker, MD  oseltamivir (TAMIFLU) 75 MG capsule Take 1 capsule (75 mg total) by mouth every 12 (twelve) hours. Patient not taking: Reported on 03/24/2019 06/07/15   Horton, Mayer Masker, MD  traMADol (ULTRAM) 50 MG tablet Take 1 tablet (50 mg total) by mouth every 6 (six) hours as needed. Patient not taking: Reported on 03/24/2019 06/28/15   Danelle Berry, PA-C    Allergies    Patient has no known allergies.  Review of Systems   Review  of Systems  Constitutional: Negative for chills and fever.  HENT: Positive for ear pain and trouble swallowing. Negative for congestion.   Eyes: Negative for photophobia and visual disturbance.  Respiratory: Negative for cough, chest tightness and shortness of breath.   Cardiovascular: Negative for chest pain.  Gastrointestinal: Negative for abdominal pain, nausea and vomiting.  Genitourinary: Negative for dysuria.  Musculoskeletal: Negative for back pain and neck pain.  Neurological: Positive for seizures and headaches. Negative for facial asymmetry and weakness.  All other systems reviewed and are negative.   Physical Exam Updated Vital Signs BP  112/80   Pulse 72   Temp 99.6 F (37.6 C) (Oral)   Resp 17   SpO2 96%   Physical Exam Vitals and nursing note reviewed.  Constitutional:      General: He is not in acute distress.    Appearance: He is well-developed.  HENT:     Head: Normocephalic and atraumatic.     Right Ear: Tympanic membrane, ear canal and external ear normal.     Left Ear: Tympanic membrane, ear canal and external ear normal.     Mouth/Throat:     Mouth: Mucous membranes are moist.     Comments: No tongue trauma Eyes:     Extraocular Movements: Extraocular movements intact.     Conjunctiva/sclera: Conjunctivae normal.     Pupils: Pupils are equal, round, and reactive to light.  Cardiovascular:     Rate and Rhythm: Normal rate and regular rhythm.     Heart sounds: Normal heart sounds. No murmur.  Pulmonary:     Effort: Pulmonary effort is normal. No respiratory distress.     Breath sounds: Normal breath sounds.  Abdominal:     General: Abdomen is flat.     Palpations: Abdomen is soft.     Tenderness: There is no abdominal tenderness.  Musculoskeletal:     Cervical back: Neck supple.     Right lower leg: No edema.     Left lower leg: No edema.  Skin:    General: Skin is warm and dry.  Neurological:     General: No focal deficit present.     Mental Status: He is alert and oriented to person, place, and time.     Cranial Nerves: No cranial nerve deficit.     Motor: No weakness.  Psychiatric:        Mood and Affect: Mood normal.        Behavior: Behavior normal.     ED Results / Procedures / Treatments   Labs (all labs ordered are listed, but only abnormal results are displayed) Labs Reviewed  BASIC METABOLIC PANEL - Abnormal; Notable for the following components:      Result Value   Glucose, Bld 63 (*)    All other components within normal limits  CBC WITH DIFFERENTIAL/PLATELET - Abnormal; Notable for the following components:   Hemoglobin 12.4 (*)    HCT 38.4 (*)    All other components  within normal limits  RAPID URINE DRUG SCREEN, HOSP PERFORMED - Abnormal; Notable for the following components:   Tetrahydrocannabinol POSITIVE (*)    All other components within normal limits  CBG MONITORING, ED - Abnormal; Notable for the following components:   Glucose-Capillary 46 (*)    All other components within normal limits  CBG MONITORING, ED - Abnormal; Notable for the following components:   Glucose-Capillary 50 (*)    All other components within normal limits  CBG MONITORING, ED - Abnormal;  Notable for the following components:   Glucose-Capillary 114 (*)    All other components within normal limits  CBG MONITORING, ED - Abnormal; Notable for the following components:   Glucose-Capillary 123 (*)    All other components within normal limits  CBG MONITORING, ED  CBG MONITORING, ED    EKG None  Radiology CT Head Wo Contrast  Result Date: 03/24/2019 CLINICAL DATA:  Seizure EXAM: CT HEAD WITHOUT CONTRAST TECHNIQUE: Contiguous axial images were obtained from the base of the skull through the vertex without intravenous contrast. COMPARISON:  04/24/2018 FINDINGS: Brain: No evidence of acute infarction, hemorrhage, hydrocephalus, extra-axial collection or mass lesion/mass effect. Vascular: No hyperdense vessel or unexpected calcification. Skull: Normal. Negative for fracture or focal lesion. Sinuses/Orbits: No acute finding. Other: None. IMPRESSION: No acute intracranial pathology. Electronically Signed   By: Eddie Candle M.D.   On: 03/24/2019 18:45    Procedures .Critical Care Performed by: Lorin Glass, PA-C Authorized by: Lorin Glass, PA-C   Critical care provider statement:    Critical care time (minutes):  45   Critical care was necessary to treat or prevent imminent or life-threatening deterioration of the following conditions:  Endocrine crisis   Critical care was time spent personally by me on the following activities:  Discussions with consultants,  evaluation of patient's response to treatment, examination of patient, ordering and performing treatments and interventions, ordering and review of laboratory studies, ordering and review of radiographic studies, pulse oximetry, re-evaluation of patient's condition, obtaining history from patient or surrogate and review of old charts   (including critical care time)  Medications Ordered in ED Medications  dextrose 10 % infusion ( Intravenous Stopped 03/24/19 2005)  levETIRAcetam (KEPPRA) IVPB 1000 mg/100 mL premix (0 mg Intravenous Stopped 03/24/19 1713)  sodium chloride 0.9 % bolus 1,000 mL (0 mLs Intravenous Stopped 03/24/19 1806)  dextrose 50 % solution 25 mL (25 mLs Intravenous Given 03/24/19 1713)  thiamine (B-1) injection 100 mg (100 mg Intravenous Given 03/24/19 1713)  dextrose 50 % solution 25 mL (25 mLs Intravenous Given 03/24/19 1806)  sodium chloride 0.9 % bolus 1,000 mL (0 mLs Intravenous Stopped 03/24/19 2217)  acetaminophen (TYLENOL) tablet 650 mg (650 mg Oral Given 03/24/19 2259)  ODT zofran 4mg .   ED Course  I have reviewed the triage vital signs and the nursing notes.  Pertinent labs & imaging results that were available during my care of the patient were reviewed by me and considered in my medical decision making (see chart for details).  Clinical Course as of Dec 13 0001  Sat Mar 24, 2019  1612 Attempted to contact patient's mother with his permission.  No answer x2   [EH]  1707 Temp 98.6   [EH]  1707 Ordered D50  CBG monitoring, ED(!) [EH]  1713 Patient was noted to be hypoglycemic with a blood sugar of 46.  Given concern for seizures half an amp of D50 was ordered and given.  Fluid bolus started given softer blood pressures, but review shows he normally runs between 734- 193 systolic.  Thiamine ordered.  After d50 he reported feeling better.    [EH]  1749 Glucose-Capillary(!): 50 [EH]  2300 Patient reevaluated, he has eaten half of a sandwich with [place].  He has  not had repeat hypoglycemic episodes, and remains awake and alert.   [EH]    Clinical Course User Index [EH] Ollen Gross   MDM Rules/Calculators/A&P  Chris Krause presents today for evaluation after seizure.  He has had multiple seizures in the past and is noncompliant with his Keppra.  Per family reports patient was in a car when this started and did not fall.  He does report that he has had a dental infection and has not been taking his antibiotics.  CT head was obtained without evidence of acute abnormalities.  His white count is not significantly elevated therefore do not suspect a spread of dental infection.  BMP shows hyperglycemia.  He does not have a previous history of hypoglycemia.  He was given D50 given concern for seizure as his Keppra load was infusing.  After this he only improved slightly however remained hypoglycemic.  He was given an additional dose of D50 and started on a D10 drip.  He was given a dose of thiamine.  After Keppra infusion patient was instructed to eat and d10 drip was stopped.  He was able to eat half a [place] sandwich, and did not have repeat hypoglycemic episodes once he consumed complex carbs.  We discussed the importance of continuing to eat complex carbs today.  He was given Tylenol for headache.  No evidence of meningismus and he reportedly normally gets headaches after a seizures.  Patient requested discharge.  We discussed that he may not drive for the next 6 months along with general seizure precautions.  He is given a prescription for keppra along with 1 refill and instructed to follow-up with his neurologist.  We also discussed that he needs to eat additional complex carbs tonight/high-protein meal and he states his understanding.  He is still requesting discharge.  When patient got up when attempted discharge he got nauseous and vomiting in the trash can.  Given his hypoglycemia He is given ODT zofran and repeat PO  challenged.  Given RX for ODT zofran.  Patient passed PO challenge.  Patient admitted he is wishing to be admitted as he doesn't have a ride home.  We discussed this is not an appropriate reason for admission.    Return precautions were discussed with patient who states their understanding.  At the time of discharge patient denied any unaddressed complaints or concerns.  Patient is agreeable for discharge home.  This patient was discussed with Dr. Effie ShyWentz.   Please note that as patient was initially discharged, he was accidentally created as a repeat visit instead of un-doing the discharge.  After this was canceled unable to undo the discharge.    Final Clinical Impression(s) / ED Diagnoses Final diagnoses:  Seizure [facility](HCC)  Personal history of noncompliance with medical treatment, presenting hazards to health  Hypoglycemia    Rx / DC Orders ED Discharge Orders         Ordered    levETIRAcetam (KEPPRA) 500 MG tablet  2 times daily     03/24/19 2324             Cristina GongHammond, Donovon Micheletti W, [place] JerseyPA-C 03/25/19 Josefa [id number]    Mancel BaleWentz, Elliott, MD 03/25/19 1036

## 2019-03-24 NOTE — ED Triage Notes (Addendum)
BIB EMS after having witnessed seizure, reported x 5 min in time, 9 years ago was first seizure. Today is his second seizure 93/50-96- 97% pt was post ictal upon EMS arriving. IV #20 left f/a CBG 135

## 2019-03-25 ENCOUNTER — Emergency Department (HOSPITAL_COMMUNITY): Admission: EM | Admit: 2019-03-25 | Discharge: 2019-03-25 | Discharge status: Absent without leave | Payer: Self-pay

## 2019-03-25 LAB — CBG MONITORING, ED: Glucose-Capillary: 94 mg/dL (ref 70–99)

## 2019-03-25 MED ORDER — ONDANSETRON 4 MG PO TBDP
ORAL_TABLET | ORAL | Status: AC
  Filled 2019-03-25: qty 1

## 2019-03-25 MED ORDER — ONDANSETRON 4 MG PO TBDP: 4.0000 mg | ORAL_TABLET | Freq: Once | ORAL | Status: DC

## 2019-03-25 MED ORDER — ONDANSETRON 4 MG PO TBDP: 4.0000 mg | ORAL_TABLET | Freq: Three times a day (TID) | ORAL | 0 refills | Status: AC | PRN

## 2020-12-03 IMAGING — CT CT HEAD W/O CM
3 series · 15 of 45 positions shown, 18 images · non-contrast
Comparison: 04/24/2018

CLINICAL DATA: Seizure

EXAM:
CT HEAD WITHOUT CONTRAST
TECHNIQUE: Contiguous axial images were obtained from the base of the skull
through the vertex without intravenous contrast.

[Series 2: head wo · axial · 0.47mm/px · z∈[+1296,+1411]mm · 9 of 28 slices shown, 12 images]
[im 3/28  brain]
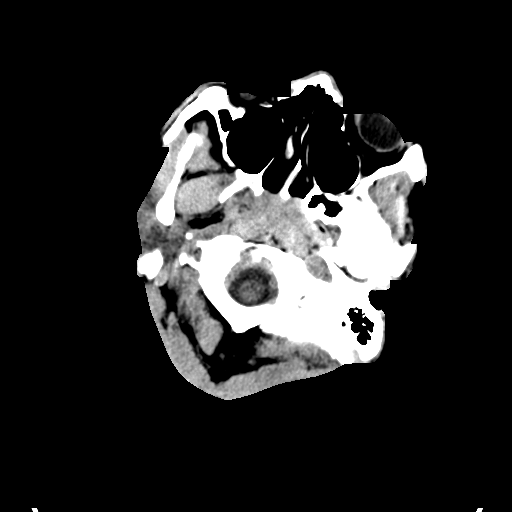
[im 3/28  bone]
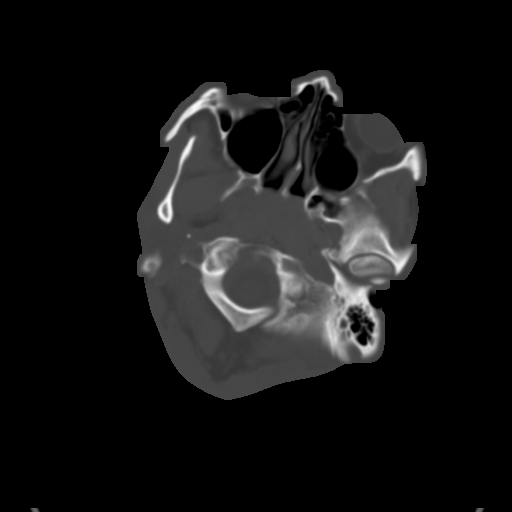
[im 6/28  brain]
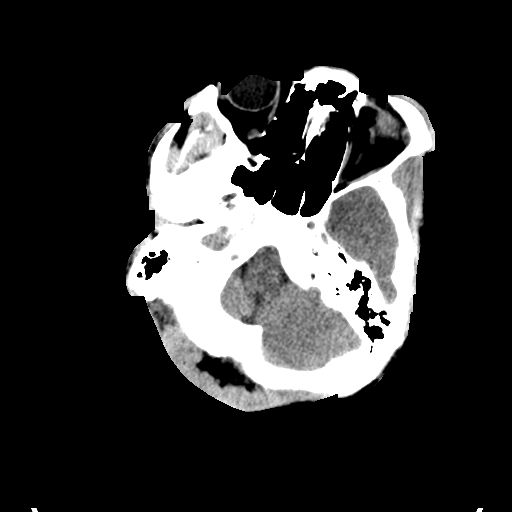
[im 9/28  brain]
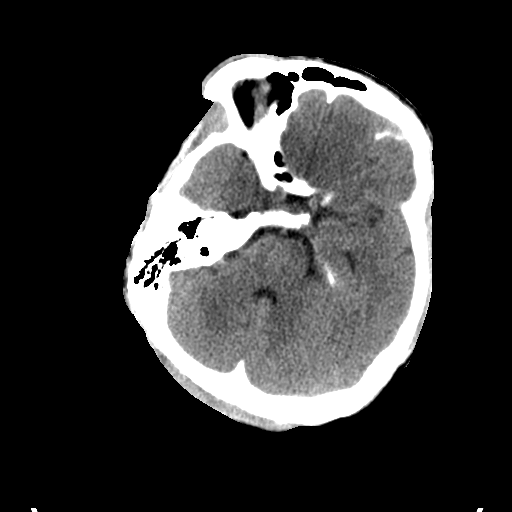
[im 12/28  brain]
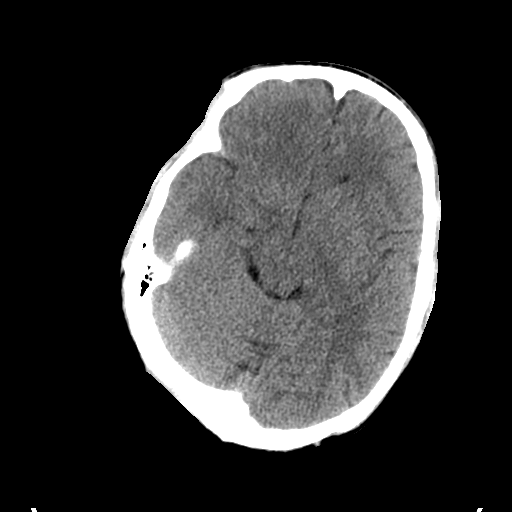
[im 15/28  brain]
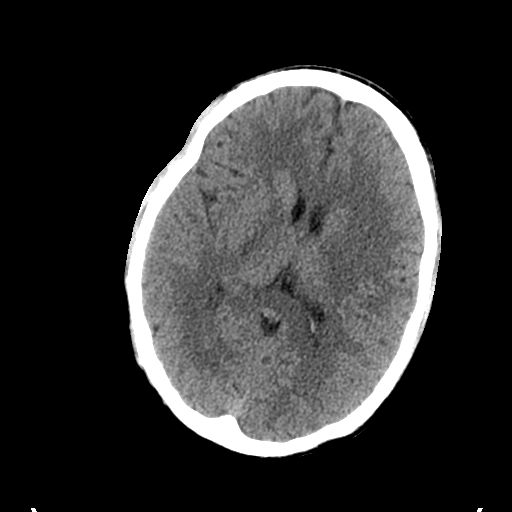
[im 15/28  bone]
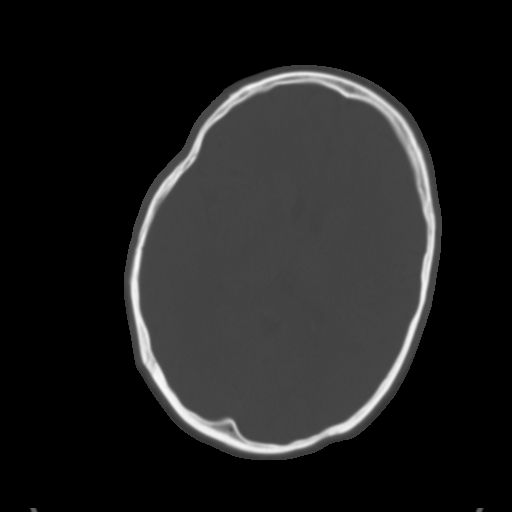
[im 17/28  brain]
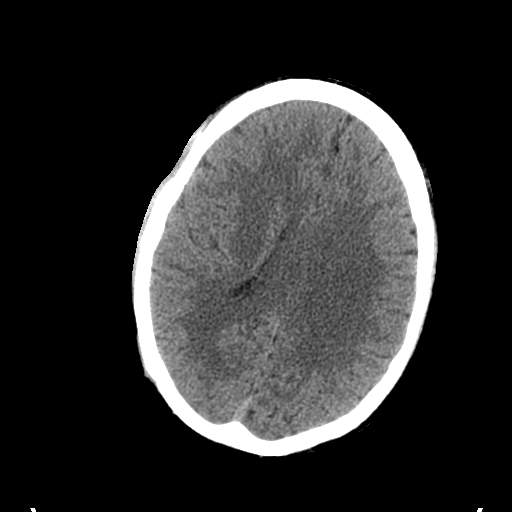
[im 20/28  brain]
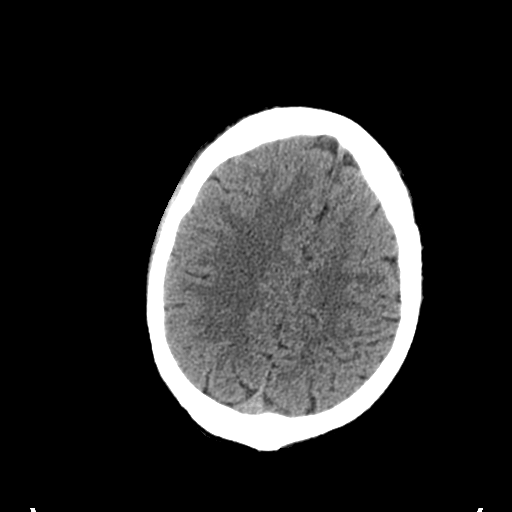
[im 23/28  brain]
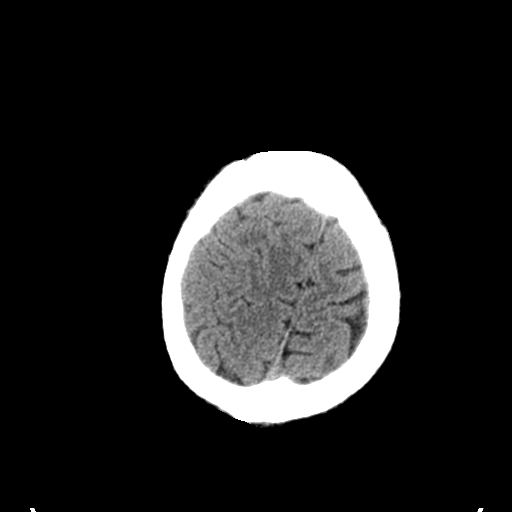
[im 26/28  brain]
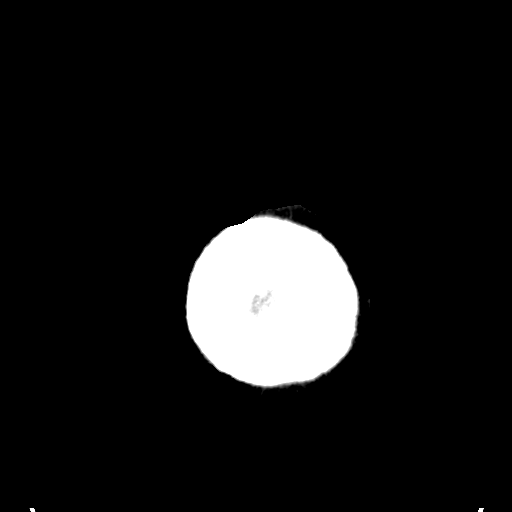
[im 26/28  bone]
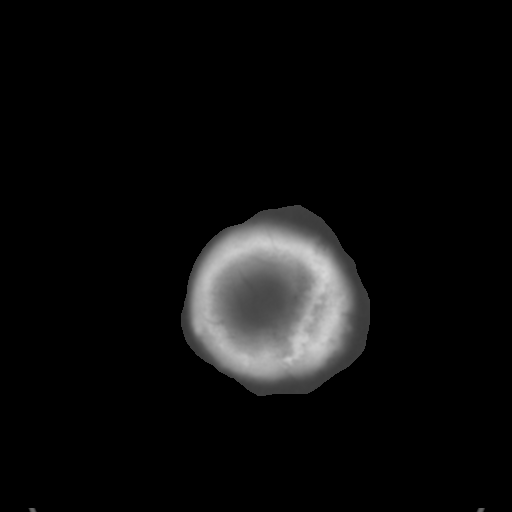

[Series 4: coronal soft tissue · coronal · 0.27mm/px · 3 of 76 slices shown]
[im 26/76  brain]
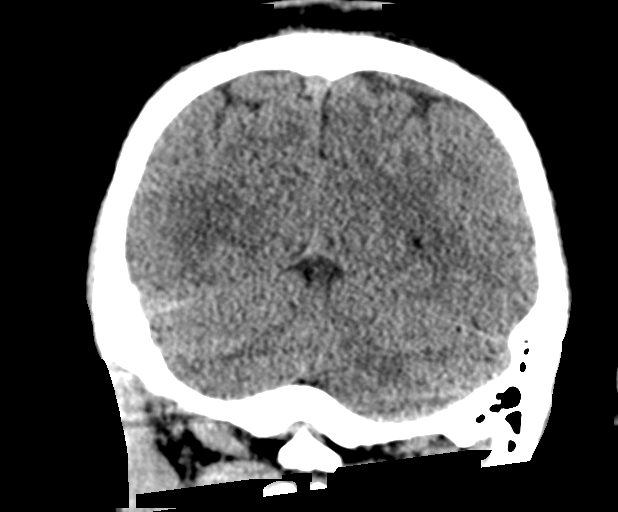
[im 34/76  brain]
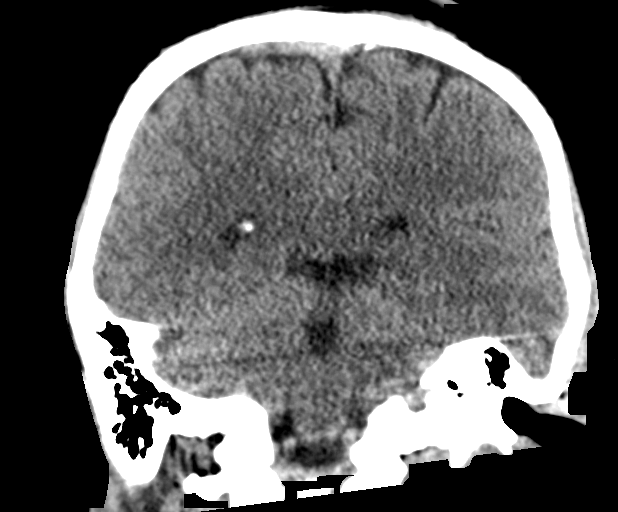
[im 42/76  brain]
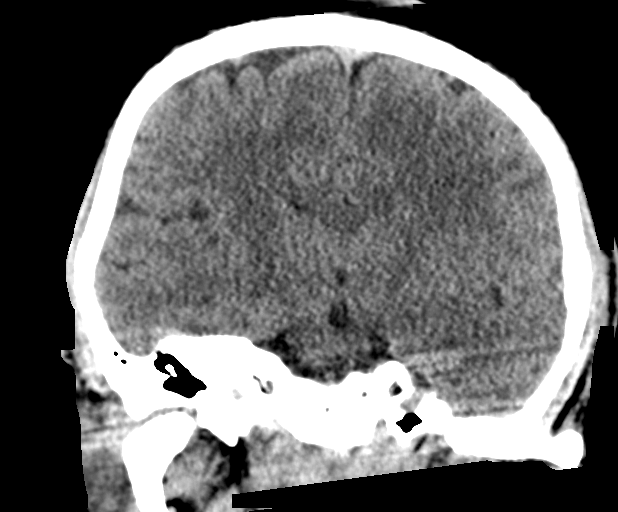

[Series 5: sagittal soft tissue · sagittal · 0.29mm/px · 3 of 55 slices shown]
[im 23/55  brain]
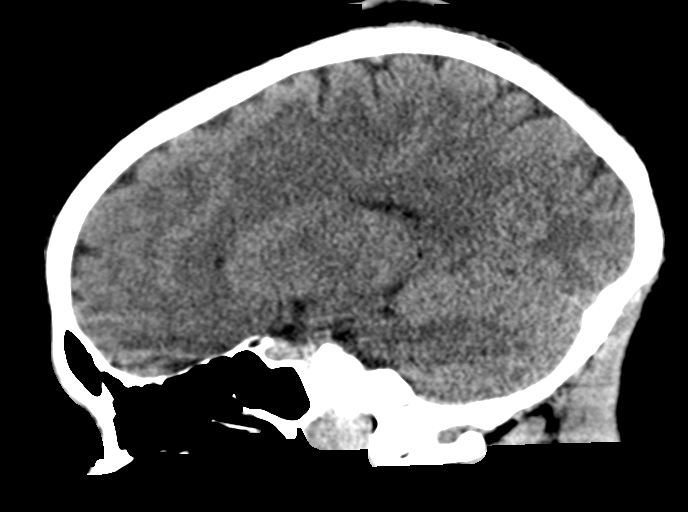
[im 28/55  brain]
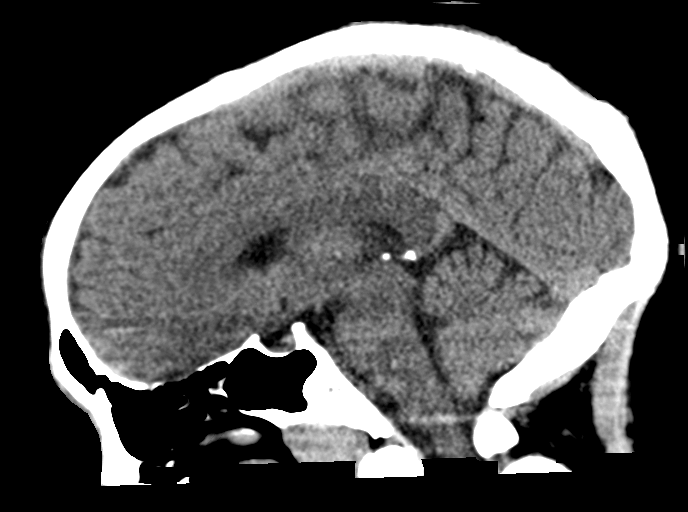
[im 32/55  brain]
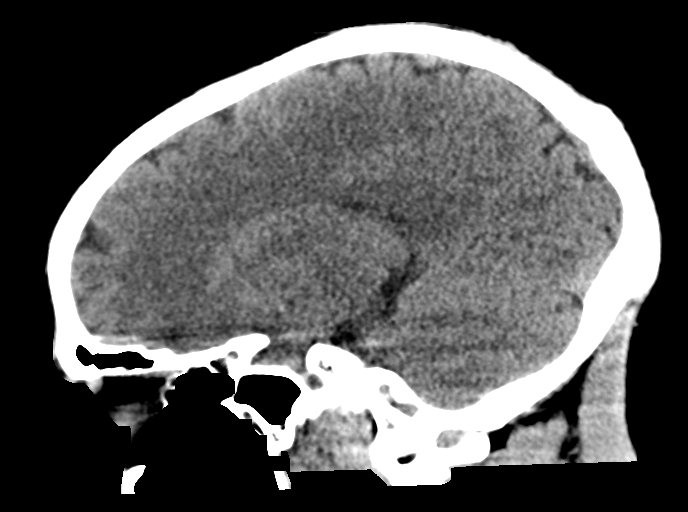

[15 of 45 positions shown; findings below may reference images not displayed]

FINDINGS: Brain: No evidence of acute infarction, hemorrhage, hydrocephalus,
extra-axial collection or mass lesion/mass effect.

Vascular: No hyperdense vessel or unexpected calcification.

Skull: Normal. Negative for fracture or focal lesion.

Sinuses/Orbits: No acute finding.

Other: None.
IMPRESSION: No acute intracranial pathology.

## 2022-04-24 ENCOUNTER — Emergency Department (HOSPITAL_COMMUNITY): Payer: Commercial Managed Care - HMO

## 2022-04-24 ENCOUNTER — Other Ambulatory Visit: Payer: Self-pay

## 2022-04-24 ENCOUNTER — Emergency Department (HOSPITAL_COMMUNITY)
Admission: EM | Admit: 2022-04-24 | Discharge: 2022-04-24 | Disposition: A | Discharge status: Home / Self Care | Payer: Commercial Managed Care - HMO | Attending: Emergency Medicine | Admitting: Emergency Medicine

## 2022-04-24 DIAGNOSIS — W25XXXA Contact with sharp glass, initial encounter: Secondary | ICD-10-CM | POA: Diagnosis not present

## 2022-04-24 DIAGNOSIS — S0993XA Unspecified injury of face, initial encounter: Secondary | ICD-10-CM | POA: Diagnosis present

## 2022-04-24 DIAGNOSIS — S0181XA Laceration without foreign body of other part of head, initial encounter: Secondary | ICD-10-CM

## 2022-04-24 DIAGNOSIS — S01111A Laceration without foreign body of right eyelid and periocular area, initial encounter: Secondary | ICD-10-CM | POA: Insufficient documentation

## 2022-04-24 MED ORDER — LIDOCAINE-EPINEPHRINE-TETRACAINE (LET) TOPICAL GEL
3.0000 mL | Freq: Once | TOPICAL | Status: AC
  Administered 2022-04-24: 3 mL via TOPICAL
  Filled 2022-04-24: qty 3

## 2022-04-24 MED ORDER — IBUPROFEN 800 MG PO TABS
800.0000 mg | ORAL_TABLET | Freq: Once | ORAL | Status: AC
  Administered 2022-04-24: 800 mg via ORAL
  Filled 2022-04-24: qty 1

## 2022-04-24 MED ORDER — LIDOCAINE HCL (PF) 1 % IJ SOLN
5.0000 mL | Freq: Once | INTRAMUSCULAR | Status: DC
  Filled 2022-04-24: qty 30

## 2022-04-24 NOTE — ED Provider Notes (Signed)
[facility]-EMERGENCY DEPT Provider Note   CSN: [id number] Arrival date & time: 04/24/22  1324     History  Chief Complaint  Patient presents with   Facial Laceration    Chris Krause is a 32 y.o. male with no relevant medical history.  The patient presents to the ED for evaluation of facial laceration.  The patient reports that prior to arrival his girlfriend was attempting to hit a spider on a mirror.  The patient reports that during the course of these events a mirror struck the right side of his face resulting in a laceration superior to his eye.  Patient states last tetanus shot was 1-1/2 years ago while incarcerated.  Patient reports that he does not have any foreign body sensation to right eye, he is not having any issues moving his eye.  The patient is complaining of swelling inferior to his right eye.  Patient denies any medications prior to arrival.  HPI     Home Medications Prior to Admission medications   Medication Sig Start Date End Date Taking? Authorizing Provider  acetaminophen (TYLENOL) 325 MG tablet Take 650 mg by mouth every 6 (six) hours as needed for mild pain or moderate pain.    [provider]  bacitracin ointment Apply 1 application topically 2 (two) times daily. Patient not taking: Reported on 03/24/2019 07/26/17   Antony Madura, PA-C  ibuprofen (ADVIL,MOTRIN) 400 MG tablet Take 1 tablet (400 mg total) by mouth every 6 (six) hours as needed. Patient taking differently: Take 400 mg by mouth every 6 (six) hours as needed for mild pain or moderate pain.  06/28/15   Danelle Berry, PA-C  levETIRAcetam (KEPPRA) 500 MG tablet Take 1 tablet (500 mg total) by mouth 2 (two) times daily. 03/24/19   Cristina Gong, PA-C  lidocaine (XYLOCAINE) 2 % solution Use as directed 15 mLs in the mouth or throat as needed for mouth pain. Patient not taking: Reported on 03/24/2019 03/05/19   Joy, Ines Bloomer C, PA-C  ondansetron (ZOFRAN ODT) 4 MG  disintegrating tablet Take 1 tablet (4 mg total) by mouth every 8 (eight) hours as needed for nausea or vomiting. 03/25/19   Cristina Gong, PA-C  oseltamivir (TAMIFLU) 75 MG capsule Take 1 capsule (75 mg total) by mouth every 12 (twelve) hours. Patient not taking: Reported on 03/24/2019 06/07/15   Horton, Mayer Masker, MD  traMADol (ULTRAM) 50 MG tablet Take 1 tablet (50 mg total) by mouth every 6 (six) hours as needed. Patient not taking: Reported on 03/24/2019 06/28/15   Danelle Berry, PA-C      Allergies    Patient has no known allergies.    Review of Systems   Review of Systems  Physical Exam Updated Vital Signs BP 109/75 (BP Location: Left Arm)   Pulse 84   Temp 98.1 F (36.7 C) (Oral)   Resp 16   Ht 5\' 11"  (1.803 m)   Wt 65.8 kg   SpO2 98%   BMI 20.23 kg/m  Physical Exam Vitals and nursing note reviewed.  Constitutional:      General: He is not in acute distress.    Appearance: He is well-developed.  HENT:     Head: Normocephalic and atraumatic.  Eyes:     Conjunctiva/sclera: Conjunctivae normal.  Cardiovascular:     Rate and Rhythm: Normal rate and regular rhythm.     Heart sounds: No murmur heard. Pulmonary:     Effort: Pulmonary effort is normal. No respiratory  distress.     Breath sounds: Normal breath sounds.  Abdominal:     Palpations: Abdomen is soft.     Tenderness: There is no abdominal tenderness.  Musculoskeletal:        General: No swelling.     Cervical back: Neck supple.  Skin:    General: Skin is warm and dry.     Capillary Refill: Capillary refill takes less than 2 seconds.     Comments: 7 cm linear laceration superior to right eye involving eyebrow.  Neurological:     Mental Status: He is alert.  Psychiatric:        Mood and Affect: Mood normal.          ED Results / Procedures / Treatments   Labs (all labs ordered are listed, but only abnormal results are displayed) Labs Reviewed - No data to  display  EKG None  Radiology CT Maxillofacial Wo Contrast  Result Date: 04/24/2022 CLINICAL DATA:  Blunt facial trauma EXAM: CT MAXILLOFACIAL WITHOUT CONTRAST TECHNIQUE: Multidetector CT imaging of the maxillofacial structures was performed. Multiplanar CT image reconstructions were also generated. RADIATION DOSE REDUCTION: This exam was performed according to the departmental dose-optimization program which includes automated exposure control, adjustment of the mA and/or kV according to patient size and/or use of iterative reconstruction technique. COMPARISON:  Head CT 04/01/2019 FINDINGS: Osseous: Negative for fracture or mandibular dislocation. Dental dental disease with periapical lucencies and cavities. Orbits: No evidence of postseptal injury. Sinuses: Negative for hemosinus Soft tissues: Laceration appearance above the right eye without opaque foreign body. Limited intracranial: Negative IMPRESSION: Negative for facial fracture or foreign body. Electronically Signed   By: Jorje Guild M.D.   On: 04/24/2022 10:24    Procedures .Marland KitchenLaceration Repair  Date/Time: 04/24/2022 11:35 AM  Performed by: Azucena Cecil, PA-C Authorized by: Azucena Cecil, PA-C   Consent:    Consent obtained:  Verbal   Consent given by:  Patient   Risks discussed:  Infection, need for additional repair, nerve damage, poor wound healing, poor cosmetic result, pain, retained foreign body, tendon damage and vascular damage   Alternatives discussed:  No treatment Universal protocol:    Patient identity confirmed:  Verbally with patient and arm band Anesthesia:    Anesthesia method:  Local infiltration and topical application   Topical anesthetic:  LET   Local anesthetic:  Lidocaine 1% w/o epi Laceration details:    Location:  Face   Face location:  R eyebrow   Length (cm):  7 Treatment:    Area cleansed with:  Saline   Amount of cleaning:  Standard   Irrigation solution:  Sterile saline    Irrigation method:  Syringe   Debridement:  None Skin repair:    Repair method:  Sutures   Suture size:  5-0   Suture material:  Prolene   Suture technique:  Simple interrupted   Number of sutures:  5 Approximation:    Approximation:  Close Repair type:    Repair type:  Simple Post-procedure details:    Dressing:  Non-adherent dressing   Procedure completion:  Tolerated well, no immediate complications    Medications Ordered in ED Medications  lidocaine (PF) (XYLOCAINE) 1 % injection 5 mL (has no administration in time range)  ibuprofen (ADVIL) tablet 800 mg (800 mg Oral Given 04/24/22 1033)  lidocaine-EPINEPHrine-tetracaine (LET) topical gel (3 mLs Topical Given 04/24/22 1031)    ED Course/ Medical Decision Making/ A&P  Medical Decision Making Amount and/or Complexity of Data Reviewed Radiology: ordered.  Risk Prescription drug management.   32 year old male presents to the ED for evaluation of eyebrow laceration.  Please see HPI for further details.  On examination the patient is afebrile and nontachycardic.  Lung sounds clear bilaterally, not hypoxic.  Patient abdomen soft and compressible throughout.  Patient does have 7 cm laceration superior to right.  Patient EOMs intact, nonpainful.  Patient does have slight soft tissue swelling inferiorly to his right eye.  Due to this, we will proceed with CT maxillofacial without contrast.  Patient reports that he is up-to-date on tetanus, received it 1-1/2 years ago while being incarcerated.  CT maxillofacial shows no evidence of fracture.  Patient provided ibuprofen for pain.  Patient had laceration repaired per procedure note.  Patient will be advised to return to ED or urgent care in 7 to 10 days for suture removal.  Patient given instructions on how to care for wound at home.  Patient discharged in stable condition.  Final Clinical Impression(s) / ED Diagnoses Final diagnoses:  Facial laceration,  initial encounter    Rx / DC Orders ED Discharge Orders     None         Lawana Chambers 04/24/22 1136    Blanchie Dessert, MD 04/24/22 1520

## 2022-04-24 NOTE — ED Triage Notes (Signed)
Pt bib ems w/right eyebrow laceration.

## 2022-04-24 NOTE — Discharge Instructions (Signed)
Return to ED with any new or worsening signs or symptoms such as signs of infection.  This includes redness, drainage of pus Please return to this ED or an urgent care and 7 to 10 days for suture removal. Please read attached guide concerning laceration care
